# Patient Record
Sex: Female | Born: 1980 | Race: White | Hispanic: No | Marital: Single | Smoking: Current every day smoker
Health system: Southern US, Community
[De-identification: ages and names within clinical notes are randomized; demographics above are authoritative.]

## PROBLEM LIST (undated history)

## (undated) DIAGNOSIS — M797 Fibromyalgia: Secondary | ICD-10-CM

## (undated) DIAGNOSIS — M549 Dorsalgia, unspecified: Secondary | ICD-10-CM

## (undated) DIAGNOSIS — Z9109 Other allergy status, other than to drugs and biological substances: Secondary | ICD-10-CM

## (undated) DIAGNOSIS — G8929 Other chronic pain: Secondary | ICD-10-CM

---

## 2002-06-30 ENCOUNTER — Emergency Department (HOSPITAL_COMMUNITY): Admission: EM | Admit: 2002-06-30 | Discharge: 2002-06-30 | Payer: Self-pay | Admitting: *Deleted

## 2003-12-24 ENCOUNTER — Emergency Department (HOSPITAL_COMMUNITY): Admission: EM | Admit: 2003-12-24 | Discharge: 2003-12-24 | Payer: Self-pay | Admitting: Emergency Medicine

## 2004-02-10 ENCOUNTER — Other Ambulatory Visit: Admission: RE | Admit: 2004-02-10 | Discharge: 2004-02-10 | Payer: Self-pay | Admitting: Obstetrics and Gynecology

## 2004-08-02 ENCOUNTER — Inpatient Hospital Stay (HOSPITAL_COMMUNITY): Admission: AD | Admit: 2004-08-02 | Discharge: 2004-08-05 | Payer: Self-pay | Admitting: Obstetrics and Gynecology

## 2004-10-07 ENCOUNTER — Encounter: Admission: RE | Admit: 2004-10-07 | Discharge: 2004-10-07 | Payer: Self-pay | Admitting: Orthopedic Surgery

## 2005-04-22 ENCOUNTER — Emergency Department (HOSPITAL_COMMUNITY): Admission: EM | Admit: 2005-04-22 | Discharge: 2005-04-22 | Payer: Self-pay | Admitting: Emergency Medicine

## 2005-07-03 ENCOUNTER — Emergency Department (HOSPITAL_COMMUNITY): Admission: EM | Admit: 2005-07-03 | Discharge: 2005-07-03 | Payer: Self-pay | Admitting: Emergency Medicine

## 2005-08-20 ENCOUNTER — Emergency Department (HOSPITAL_COMMUNITY): Admission: EM | Admit: 2005-08-20 | Discharge: 2005-08-20 | Payer: Self-pay | Admitting: Emergency Medicine

## 2005-09-18 ENCOUNTER — Emergency Department (HOSPITAL_COMMUNITY): Admission: EM | Admit: 2005-09-18 | Discharge: 2005-09-18 | Payer: Self-pay | Admitting: Emergency Medicine

## 2005-09-24 ENCOUNTER — Emergency Department (HOSPITAL_COMMUNITY): Admission: EM | Admit: 2005-09-24 | Discharge: 2005-09-24 | Payer: Self-pay | Admitting: Emergency Medicine

## 2005-10-08 ENCOUNTER — Emergency Department (HOSPITAL_COMMUNITY): Admission: EM | Admit: 2005-10-08 | Discharge: 2005-10-08 | Payer: Self-pay | Admitting: Emergency Medicine

## 2005-10-15 ENCOUNTER — Emergency Department (HOSPITAL_COMMUNITY): Admission: EM | Admit: 2005-10-15 | Discharge: 2005-10-15 | Payer: Self-pay | Admitting: Emergency Medicine

## 2005-10-23 ENCOUNTER — Emergency Department (HOSPITAL_COMMUNITY): Admission: EM | Admit: 2005-10-23 | Discharge: 2005-10-23 | Payer: Self-pay | Admitting: Emergency Medicine

## 2005-11-02 ENCOUNTER — Emergency Department (HOSPITAL_COMMUNITY): Admission: EM | Admit: 2005-11-02 | Discharge: 2005-11-02 | Payer: Self-pay | Admitting: Emergency Medicine

## 2005-11-18 ENCOUNTER — Emergency Department (HOSPITAL_COMMUNITY): Admission: EM | Admit: 2005-11-18 | Discharge: 2005-11-18 | Payer: Self-pay | Admitting: Emergency Medicine

## 2005-12-16 ENCOUNTER — Emergency Department (HOSPITAL_COMMUNITY): Admission: EM | Admit: 2005-12-16 | Discharge: 2005-12-16 | Payer: Self-pay | Admitting: Emergency Medicine

## 2006-04-02 ENCOUNTER — Emergency Department (HOSPITAL_COMMUNITY): Admission: EM | Admit: 2006-04-02 | Discharge: 2006-04-02 | Payer: Self-pay | Admitting: Emergency Medicine

## 2006-05-02 ENCOUNTER — Emergency Department (HOSPITAL_COMMUNITY): Admission: EM | Admit: 2006-05-02 | Discharge: 2006-05-02 | Payer: Self-pay | Admitting: Emergency Medicine

## 2006-07-17 ENCOUNTER — Emergency Department (HOSPITAL_COMMUNITY): Admission: EM | Admit: 2006-07-17 | Discharge: 2006-07-17 | Payer: Self-pay | Admitting: Emergency Medicine

## 2006-08-14 ENCOUNTER — Encounter: Admission: RE | Admit: 2006-08-14 | Discharge: 2006-11-12 | Payer: Self-pay | Admitting: Orthopedic Surgery

## 2006-10-11 ENCOUNTER — Emergency Department (HOSPITAL_COMMUNITY): Admission: EM | Admit: 2006-10-11 | Discharge: 2006-10-11 | Payer: Self-pay | Admitting: Emergency Medicine

## 2006-11-28 ENCOUNTER — Ambulatory Visit (HOSPITAL_COMMUNITY): Admission: RE | Admit: 2006-11-28 | Discharge: 2006-11-28 | Payer: Self-pay | Admitting: Obstetrics and Gynecology

## 2007-06-08 ENCOUNTER — Emergency Department (HOSPITAL_COMMUNITY): Admission: EM | Admit: 2007-06-08 | Discharge: 2007-06-08 | Payer: Self-pay | Admitting: Emergency Medicine

## 2007-09-07 ENCOUNTER — Emergency Department (HOSPITAL_COMMUNITY): Admission: EM | Admit: 2007-09-07 | Discharge: 2007-09-07 | Payer: Self-pay | Admitting: Emergency Medicine

## 2007-11-13 ENCOUNTER — Emergency Department (HOSPITAL_COMMUNITY): Admission: EM | Admit: 2007-11-13 | Discharge: 2007-11-13 | Payer: Self-pay | Admitting: Emergency Medicine

## 2008-03-09 ENCOUNTER — Emergency Department (HOSPITAL_COMMUNITY): Admission: EM | Admit: 2008-03-09 | Discharge: 2008-03-09 | Payer: Self-pay | Admitting: Emergency Medicine

## 2008-05-04 ENCOUNTER — Encounter: Payer: Self-pay | Admitting: Emergency Medicine

## 2008-05-05 ENCOUNTER — Inpatient Hospital Stay (HOSPITAL_COMMUNITY): Admission: AD | Admit: 2008-05-05 | Discharge: 2008-05-07 | Payer: Self-pay | Admitting: *Deleted

## 2008-05-05 ENCOUNTER — Ambulatory Visit: Payer: Self-pay | Admitting: *Deleted

## 2009-02-01 ENCOUNTER — Emergency Department (HOSPITAL_COMMUNITY): Admission: EM | Admit: 2009-02-01 | Discharge: 2009-02-01 | Payer: Self-pay | Admitting: Emergency Medicine
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
## 2010-05-11 IMAGING — US US PELVIS COMPLETE MODIFY
1 series · 14 of 25 positions shown · non-contrast
Comparison: None

CLINICAL DATA: Abdominal swelling.  Rule out torsion.

TRANSABDOMINAL AND TRANSVAGINAL ULTRASOUND OF PELVIS
DOPPLER ULTRASOUND OF OVARIES
TECHNIQUE: Both transabdominal and transvaginal ultrasound
examinations of the pelvis were performed including evaluation of
the uterus, ovaries, adnexal regions, and pelvic cul-de-sac. Color
and duplex Doppler ultrasound was utilized to evaluate blood flow
to the ovaries.

[Series 1: unknown · 0.25mm/px · 14 of 48 slices shown]
[im 1/48]
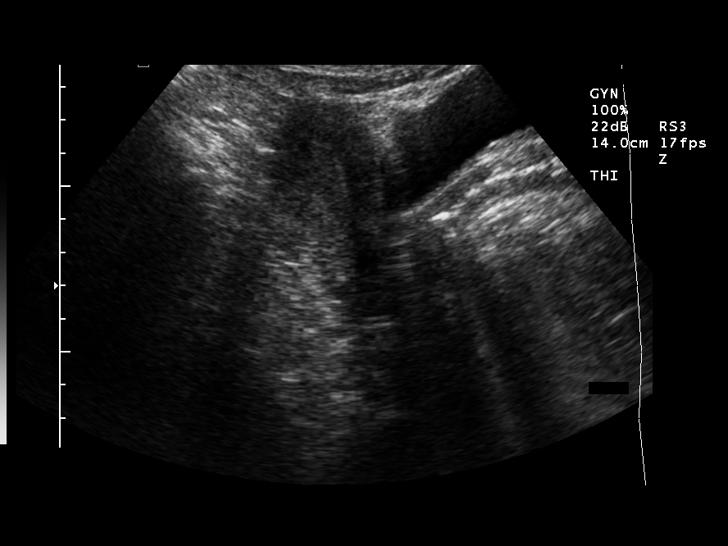
[im 4/48]
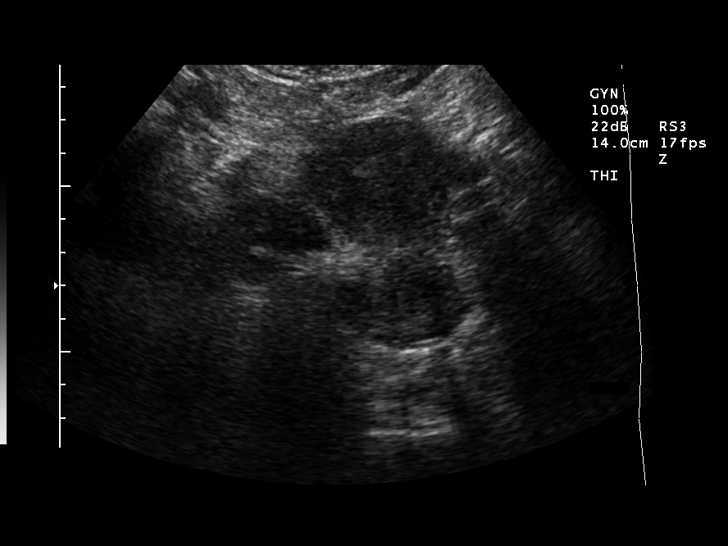
[im 8/48]
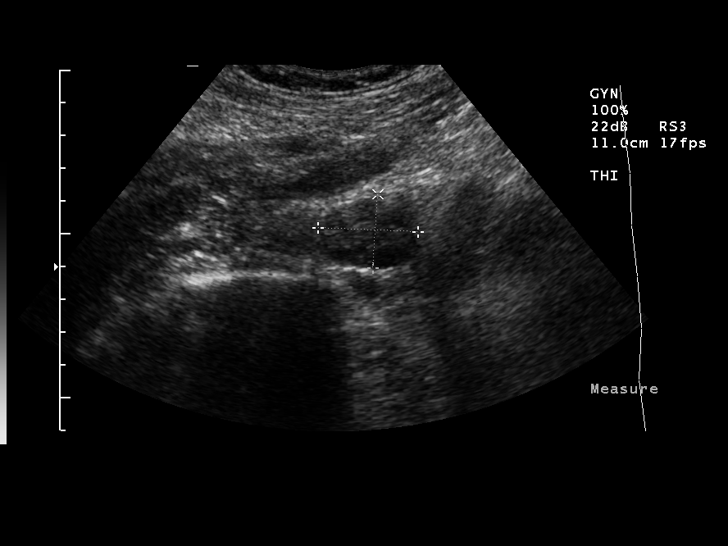
[im 12/48]
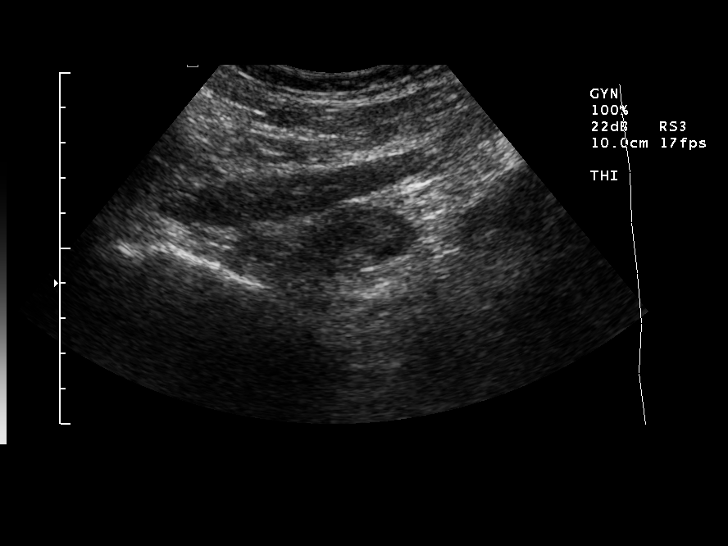
[im 16/48]
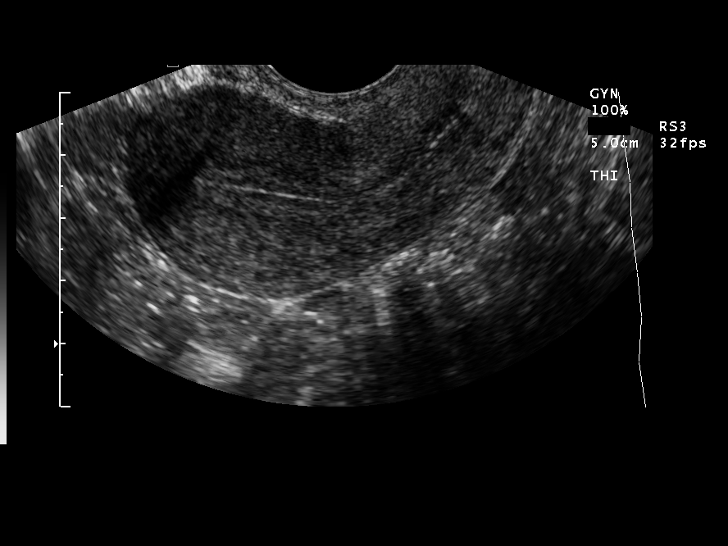
[im 18/48]
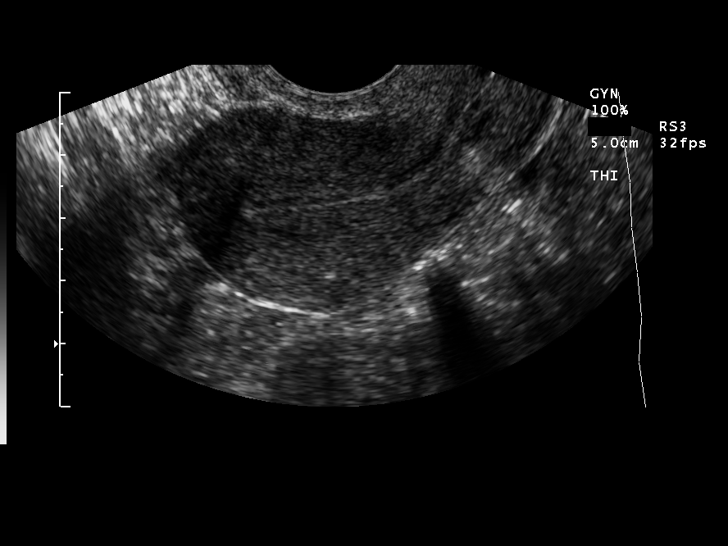
[im 22/48]
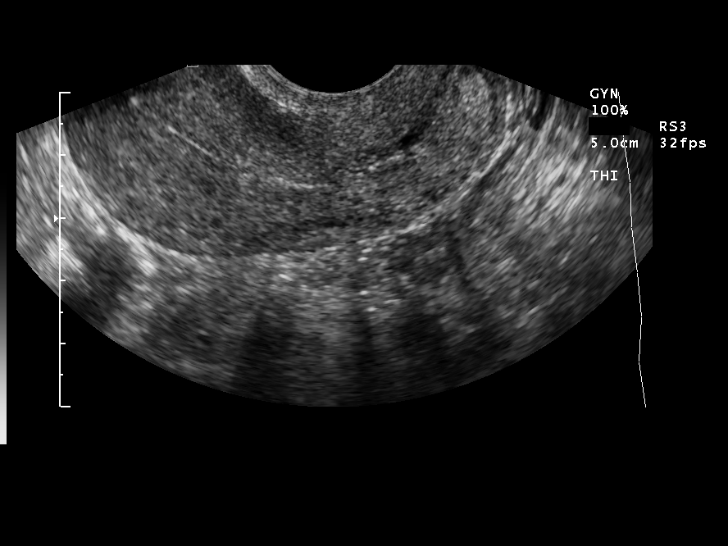
[im 26/48]
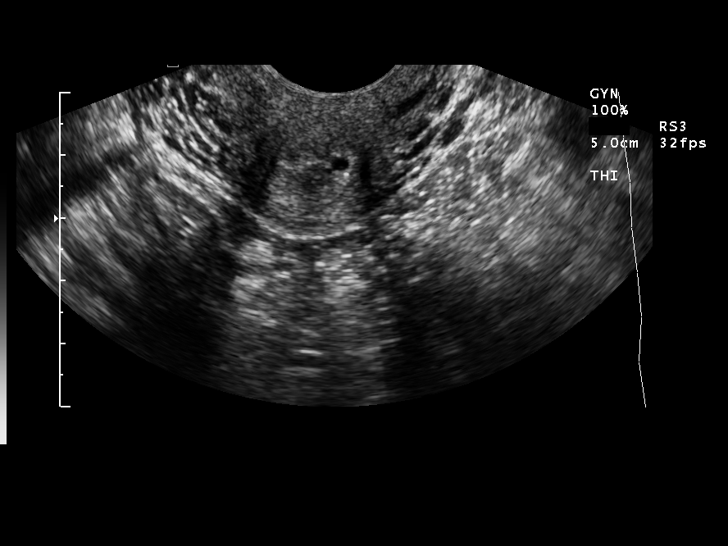
[im 30/48]
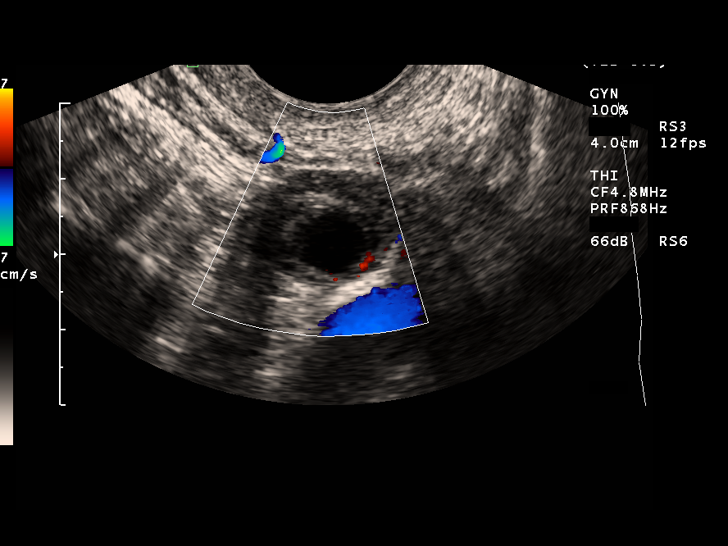
[im 32/48]
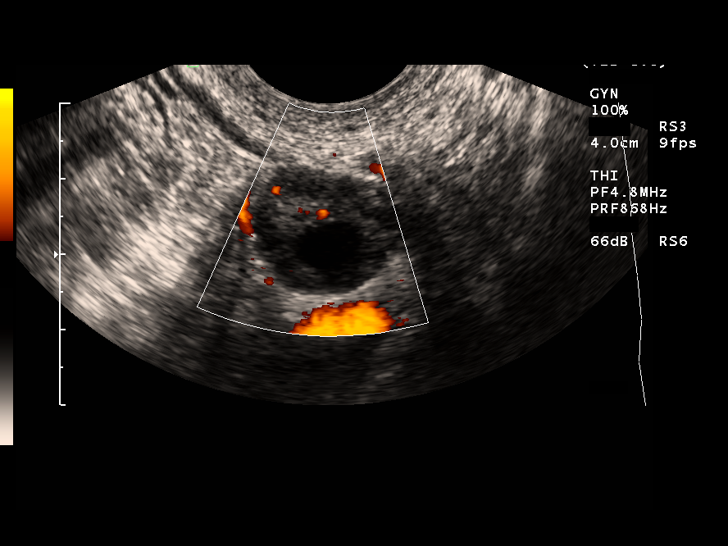
[im 36/48]
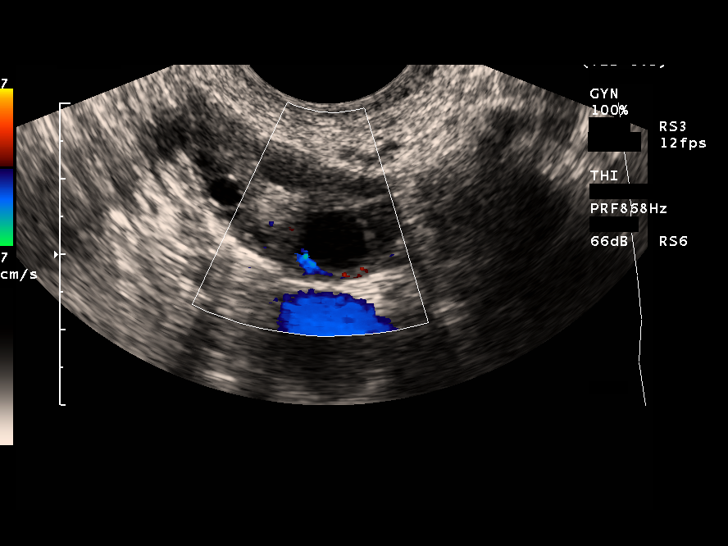
[im 40/48]
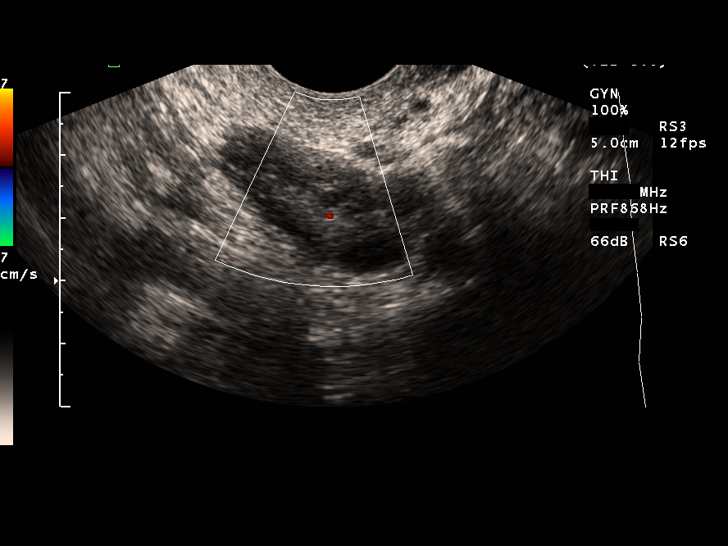
[im 44/48]
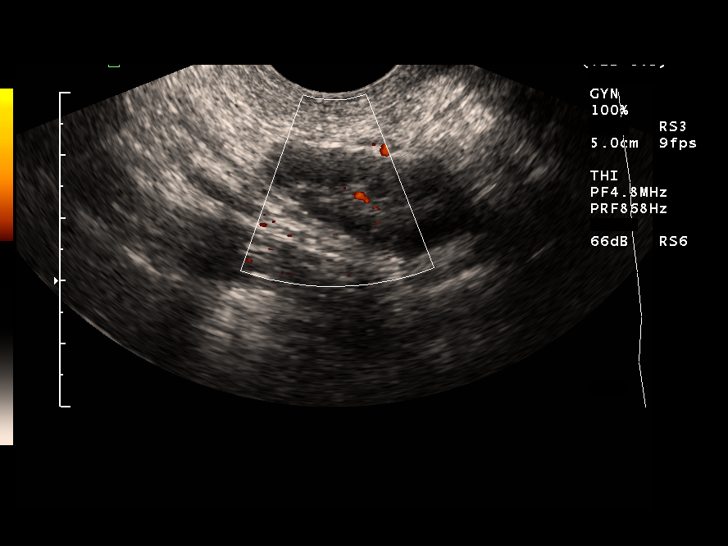
[im 48/48]
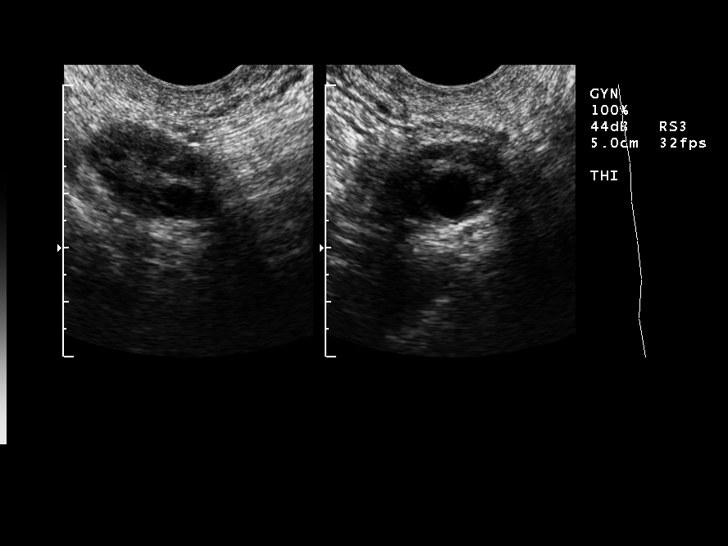

[14 of 25 positions shown; findings below may reference images not displayed]

FINDINGS: The uterus is 7.0 x 4.1 x 3.8 cm.  The endometrium has a
normal appearance and measures 1 mm in thickness.  The right ovary
is 3.3 x 2.6 x 1.8 cm.  Arterial and venous blood flow is
identified within the right ovary.  The left ovary is 3.3 x 2.0 x
1.1 cm.  Arterial and venous blood flow are also identified on the
left.  No free pelvic fluid is noted.
IMPRESSION: Normal pelvic ultrasound.  There is no evidence for complete
torsion in either ovary at the time of the exam.
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
## 2011-03-22 NOTE — Discharge Summary (Signed)
Angie Humphrey, Angie Humphrey              ACCOUNT NO.:  [id number]   MEDICAL RECORD NO.:  [id number]          PATIENT TYPE:  INP   LOCATION:  9132                          [facility]ACILITY:  [facility]   PHYSICIAN:  Zenaida Niece, M.D.DATE OF BIRTH:  June 02, 1981   DATE OF ADMISSION:  08/02/2004  DATE OF DISCHARGE:  08/05/2004                                 DISCHARGE SUMMARY   ADMISSION DIAGNOSIS:  Intrauterine pregnancy at 41 weeks.   DISCHARGE DIAGNOSIS:  Intrauterine pregnancy at 41 weeks.   PROCEDURE PERFORMED:  On August 03, 2004 she had a forceps assisted  vaginal delivery.   HISTORY AND PHYSICAL:  This is a 30 year old, white female, gravida 1, para  0 with an EGA of [redacted] weeks by an 11 weeks ultrasound with a due date of  July 23, 2004 who presented with the complaint of pelvic pressure and  vaginal spotting.  It is unclear from the history and physical whether she  was seen in the office for a routine exam or whether she came in for  contractions but she had a favorable exam and was admitted for induction.   PRENATAL CARE:  Complicated by an amniotic band noted on screening  ultrasound and initial followup ultrasound with a third ultrasound on April 18, 2004 revealing no amniotic band.   PRENATAL LABORATORY DATA:  Blood type is A positive with a negative antibody  screen.  RPR nonreactive.  Rubella immune.  Hepatitis B surface antigen  negative.  HIV negative.  Gonorrhea was negative.  Chlamydia was positive.  Triple screen was normal.  Group B Streptococcus was negative.  One hour  Glucola was 105.   PAST HISTORY:   ALLERGIES:  She has allergies to aspirin and ibuprofen.   PAST MEDICAL HISTORY:  History of depression and recent history of  Chlamydia.   FAMILY HISTORY:  She has a Family History of von Willebrand's disease.   PHYSICAL EXAMINATION:  VITAL SIGNS:  She was afebrile with stable vital  signs.  ABDOMEN:  Was gravid with a fundal height of 35 cm.  Fetal heart  tracing was  normal.  The cervix was 2, 70, -2, and vertex.   HOSPITAL COURSE:  The patient was admitted and started on Pitocin.  She  contracted well and entered labor and received an epidural.  During labor,  she had several episodes of decelerations followed by Dr. Ambrose Mantle.  She  progressed to complete and began pushing but had persistent decelerations  and Dr. Ambrose Mantle decided to perform a forceps assisted vaginal delivery on the  morning of August 03, 2004.  This was a viable female infant with Apgar's  of 9 and 9 who weighed 6 pounds 14 ounces.  The NICU team with Angelita Ingles, M.D. attended the delivery.  The placenta delivered intact but was  small.  The uterus palpated normal.  She  had a left medial lateral  episiotomy and a right upper labial laceration repaired with 2-0 and 3-0  Vicryl.  Estimated blood loss was 400 cubic centimeters.  There was a note  that the temperature was 100.5  right before delivery.  Postpartum she had no  significant complications.  Predelivery hemoglobin was 14.4, postdelivery  was 11.8.  On postpartum day #2, she was stable for discharge home.   DISCHARGE INSTRUCTIONS:   DIET:  Regular.   DISCHARGE ACTIVITIES:  Pelvic rest.   FOLLOW UP:  In six weeks.   DISCHARGE MEDICATIONS:  Percocet #30 1-2 p.o. q.4-6h. p.r.n. pain.   She was given our discharge pamphlet.     Todd   TDM/MEDQ  D:  08/05/2004  T:  08/06/2004  Job:  [id number]   cc:   Angelita Ingles, M.D.  [address]  [place]  [place] [address]  Fax: [phone]

## 2011-03-22 NOTE — Op Note (Signed)
Angie Humphrey, Angie Humphrey              ACCOUNT NO.:  [id number]   MEDICAL RECORD NO.:  [id number]          PATIENT TYPE:  AMB   LOCATION:  SDC                           [facility]ILITY:  [facility]   PHYSICIAN:  Malachi Pro. Ambrose Mantle, M.D. DATE OF BIRTH:  [birth year]   DATE OF PROCEDURE:  11/28/2006  DATE OF DISCHARGE:                               OPERATIVE REPORT   PREOPERATIVE DIAGNOSIS:  Voluntary sterilization.   POSTOPERATIVE DIAGNOSIS:  Voluntary sterilization.   OPERATION:  Laparoscopic tubal cauterization.   OPERATOR:  Malachi Pro. Ambrose Mantle, M.D.   General anesthesia.   The patient was brought to the operating room and placed under  satisfactory general anesthesia and placed in the Allen stirrups in the  modified lithotomy position.  The lower abdomen, vagina, urethra and  vulva were prepped with Betadine solution and a Foley catheter was used  to drain the bladder.  A Hulka cannula was placed into the uterus and  attached to the posterior cervical lip.  The uterus was retroverted,  normal size.  The adnexa were free of masses.  The bladder was emptied  with a catheter.  The abdomen was draped as a sterile field.  Because of  the patient's abdominal wall configuration, I chose to make an open  laparoscopic incision, made a semilunar incision through the skin of the  lower part of the umbilicus, carried it in layers down to the fascia,  incised the fascia, and then entered the peritoneum by sharp dissection.  A pursestring suture of 0 Vicryl was placed around the umbilical  incision opening through the fascia and then the Hasson cannula was used  to go into the peritoneal cavity.  I confirmed the placement with the  microscope and then I inflated the abdomen with CO2, inserted to a  smaller trocar suprapubically through a stab wound under direct vision  to visualize the upper abdomen.  The liver was smooth.  No upper  abdominal abnormalities were noted.  I visualized the pelvis.  The  anterior and  posterior cul-de-sacs were normal.  The uterus was normal  size, although it seemed to be sort of deviated to the left.  Both tubes  and ovaries were perfectly normal.  The tubes were normal to their  fimbriated ends.  I cauterized both tubes beginning in the central part  of the tube and going toward the cornu in 3 or 4 consecutive locations  with a current of 15 until the meter read 0.  The tube tended to stick  to the instrument, but we were able to release it each time without  injury to the tube.  The cauterization was complete.  I removed the  Hasson cannula and the lower abdominal trocar, closed the umbilical  incision by pulling down tight on the 0 Vicryl suture.  This left no  defects.  I then closed the subcu tissue with 3-0 Vicryl and the skin  with interrupted 3-0 plain catgut.  The lower incision was closed with a  Steri-Strip and one suture of 3-0 plain catgut.  The patient seemed to  tolerate the procedure well.  The Hulka cannula was removed and the  patient returned to recovery in satisfactory condition.  Blood loss was  less 10 mL.      Malachi Pro. Ambrose Mantle, M.D.  Electronically Signed    TFH/MEDQ  D:  11/28/2006  T:  11/28/2006  Job:  [id number]

## 2011-08-01 LAB — HEPATIC FUNCTION PANEL
ALT: 32
AST: 27
Albumin: 3.4 — ABNORMAL LOW
Alkaline Phosphatase: 49
Bilirubin, Direct: <0.1
Total Bilirubin: 0.4
Total Protein: 6

## 2011-08-01 LAB — BASIC METABOLIC PANEL
BUN: 10
CO2: 27
Calcium: 9.2
Chloride: 110
Creatinine, Ser: 0.86
GFR calc Af Amer: >60
GFR calc non Af Amer: >60
Glucose, Bld: 98
Potassium: 3.9
Sodium: 144

## 2011-08-01 LAB — CBC
HCT: 44.8
HCT: 40.1
Hemoglobin: 15.2 — ABNORMAL HIGH
Hemoglobin: 13.7
MCHC: 34
MCHC: 34.3
MCV: 96.3
MCV: 96.3
Platelets: 277
Platelets: 213
RBC: 4.65
RBC: 4.16
RDW: 13.6
RDW: 13.9
WBC: 9.2
WBC: 7

## 2011-08-01 LAB — URINALYSIS, ROUTINE W REFLEX MICROSCOPIC
Bilirubin Urine: NEGATIVE
Glucose, UA: NEGATIVE
Hgb urine dipstick: NEGATIVE
Ketones, ur: NEGATIVE
Nitrite: NEGATIVE
Protein, ur: NEGATIVE
Specific Gravity, Urine: 1.013
Urobilinogen, UA: 1
pH: 7.5

## 2011-08-01 LAB — ETHANOL: Alcohol, Ethyl (B): 194 — ABNORMAL HIGH

## 2011-08-01 LAB — DIFFERENTIAL
Basophils Absolute: 0.1
Basophils Absolute: 0
Basophils Relative: 1
Basophils Relative: 1
Eosinophils Absolute: 0.2
Eosinophils Absolute: 0.2
Eosinophils Relative: 2
Eosinophils Relative: 3
Lymphocytes Relative: 59 — ABNORMAL HIGH
Lymphocytes Relative: 64 — ABNORMAL HIGH
Lymphs Abs: 5.4 — ABNORMAL HIGH
Lymphs Abs: 4.5 — ABNORMAL HIGH
Monocytes Absolute: 0.5
Monocytes Absolute: 0.5
Monocytes Relative: 6
Monocytes Relative: 7
Neutro Abs: 3
Neutro Abs: 1.7
Neutrophils Relative %: 33 — ABNORMAL LOW
Neutrophils Relative %: 25 — ABNORMAL LOW

## 2011-08-01 LAB — WET PREP, GENITAL
Trich, Wet Prep: NONE SEEN
Yeast Wet Prep HPF POC: NONE SEEN

## 2011-08-01 LAB — RAPID URINE DRUG SCREEN, HOSP PERFORMED
Amphetamines: NOT DETECTED
Barbiturates: NOT DETECTED
Benzodiazepines: POSITIVE — AB
Cocaine: NOT DETECTED
Opiates: NOT DETECTED
Tetrahydrocannabinol: NOT DETECTED

## 2011-08-01 LAB — GC/CHLAMYDIA PROBE AMP, GENITAL
Chlamydia, DNA Probe: POSITIVE — AB
GC Probe Amp, Genital: NEGATIVE

## 2011-08-01 LAB — VALPROIC ACID LEVEL
Valproic Acid Lvl: 31 — ABNORMAL LOW
Valproic Acid Lvl: 27.3 — ABNORMAL LOW

## 2011-08-01 LAB — URINE MICROSCOPIC-ADD ON

## 2011-08-01 LAB — ACETAMINOPHEN LEVEL: Acetaminophen (Tylenol), Serum: <10 — ABNORMAL LOW

## 2011-08-01 LAB — PREGNANCY, URINE: Preg Test, Ur: NEGATIVE

## 2011-08-01 LAB — RPR: RPR Ser Ql: NONREACTIVE

## 2011-08-01 LAB — SALICYLATE LEVEL: Salicylate Lvl: <4

## 2013-05-06 ENCOUNTER — Emergency Department (HOSPITAL_COMMUNITY)
Admission: EM | Admit: 2013-05-06 | Discharge: 2013-05-06 | Disposition: A | Discharge status: Home / Self Care | Payer: Self-pay | Attending: Emergency Medicine | Admitting: Emergency Medicine

## 2013-05-06 ENCOUNTER — Emergency Department (HOSPITAL_COMMUNITY): Payer: Self-pay

## 2013-05-06 ENCOUNTER — Encounter (HOSPITAL_COMMUNITY): Payer: Self-pay | Admitting: Emergency Medicine

## 2013-05-06 DIAGNOSIS — R197 Diarrhea, unspecified: Secondary | ICD-10-CM | POA: Insufficient documentation

## 2013-05-06 DIAGNOSIS — R109 Unspecified abdominal pain: Secondary | ICD-10-CM | POA: Insufficient documentation

## 2013-05-06 DIAGNOSIS — Z8739 Personal history of other diseases of the musculoskeletal system and connective tissue: Secondary | ICD-10-CM | POA: Insufficient documentation

## 2013-05-06 DIAGNOSIS — R102 Pelvic and perineal pain: Secondary | ICD-10-CM

## 2013-05-06 DIAGNOSIS — N83209 Unspecified ovarian cyst, unspecified side: Secondary | ICD-10-CM

## 2013-05-06 DIAGNOSIS — M549 Dorsalgia, unspecified: Secondary | ICD-10-CM | POA: Insufficient documentation

## 2013-05-06 DIAGNOSIS — R198 Other specified symptoms and signs involving the digestive system and abdomen: Secondary | ICD-10-CM | POA: Insufficient documentation

## 2013-05-06 DIAGNOSIS — N949 Unspecified condition associated with female genital organs and menstrual cycle: Secondary | ICD-10-CM | POA: Insufficient documentation

## 2013-05-06 DIAGNOSIS — N831 Corpus luteum cyst of ovary, unspecified side: Secondary | ICD-10-CM | POA: Insufficient documentation

## 2013-05-06 HISTORY — DX: Other allergy status, other than to drugs and biological substances: Z91.09

## 2013-05-06 HISTORY — DX: Fibromyalgia: M79.7

## 2013-05-06 LAB — CBC WITH DIFFERENTIAL/PLATELET
Basophils Absolute: 0 10*3/uL (ref 0.0–0.1)
Basophils Relative: 0 % (ref 0–1)
HCT: 41.2 % (ref 36.0–46.0)
MCHC: 35.2 g/dL (ref 30.0–36.0)
Monocytes Absolute: 0.5 10*3/uL (ref 0.1–1.0)
Neutro Abs: 3.6 10*3/uL (ref 1.7–7.7)
Platelets: 257 10*3/uL (ref 150–400)
RDW: 12.7 % (ref 11.5–15.5)

## 2013-05-06 LAB — WET PREP, GENITAL
Trich, Wet Prep: NONE SEEN
Yeast Wet Prep HPF POC: NONE SEEN

## 2013-05-06 LAB — HCG, QUANTITATIVE, PREGNANCY: hCG, Beta Chain, Quant, S: <1 m[IU]/mL (ref <5)

## 2013-05-06 MED ORDER — OXYCODONE-ACETAMINOPHEN 5-325 MG PO TABS
1.0000 | ORAL_TABLET | Freq: Once | ORAL | Status: AC
  Administered 2013-05-06: 1 via ORAL
  Filled 2013-05-06: qty 1

## 2013-05-06 MED ORDER — HYDROCODONE-ACETAMINOPHEN 5-325 MG PO TABS: 1.0000 | ORAL_TABLET | ORAL | Status: DC | PRN

## 2013-05-06 NOTE — ED Notes (Signed)
States that she has had multiple periods this month and is having heavy vaginal bleeding. States that she has pelvic pain when the bleeding begins.

## 2013-05-06 NOTE — ED Provider Notes (Signed)
History    CSN: [id number] Arrival date & time 05/06/13  1452  First MD Initiated Contact with Patient 05/06/13 1455     Chief Complaint  Patient presents with  . Vaginal Bleeding   (Consider location/radiation/quality/duration/timing/severity/associated sxs/prior Treatment) Patient is a 32 y.o. female presenting with vaginal bleeding. The history is provided by the patient.  Vaginal Bleeding Quality:  Bright red, heavier than menses and clots Severity:  Moderate Onset quality:  Gradual Duration:  2 days Timing:  Constant Progression:  Worsening Chronicity:  New Menstrual history:  Regular Number of pads used:  8 per day Possible pregnancy: no   Context: spontaneously   Relieved by:  Nothing Worsened by:  Nothing tried Ineffective treatments:  Acetaminophen Associated symptoms: abdominal pain and back pain   Associated symptoms: no dizziness, no dysuria, no fever, no nausea and no vaginal discharge  Dyspareunia: last had sex before the bleeding or pain started.   Risk factors: unprotected sex   Risk factors: no bleeding disorder, no hx of ectopic pregnancy, no hx of endometriosis, no gynecological surgery, does not have multiple partners, no new sexual partner (current sex partner x 4 months) and no STD    Angie Humphrey is a 32 y.o. female, G3 P1, who presents to the ED with vaginal bleeding. She states that she had a regular period and then has had more bleeding. She has had a BTL for birth control, last pap smear less than 2 years ago and was normal. She has severe pelvic pain for the past 2 days. Last intercourse was before the pain and bleeding started.  Past Medical History  Diagnosis Date  . Fibromyalgia   . Environmental allergies    History reviewed. No pertinent past surgical history. No family history on file. History  Substance Use Topics  . Smoking status: Not on file  . Smokeless tobacco: Not on file  . Alcohol Use: Not on file   OB History   Grav Para  Term Preterm Abortions TAB SAB Ect Mult Living                 Review of Systems  Constitutional: Negative for fever and chills.  HENT: Negative for ear pain, sore throat and neck pain.   Respiratory: Negative for cough and shortness of breath.   Cardiovascular: Negative for chest pain and leg swelling.  Gastrointestinal: Positive for abdominal pain and diarrhea. Negative for nausea, vomiting, constipation and blood in stool.  Genitourinary: Positive for vaginal bleeding and pelvic pain. Negative for dysuria, urgency, frequency and vaginal discharge. Dyspareunia: last had sex before the bleeding or pain started.  Musculoskeletal: Positive for back pain.  Neurological: Negative for dizziness.    Allergies  Aspirin and Ibuprofen  Home Medications  No current outpatient prescriptions on file. BP 112/85  Pulse 77  Temp(Src) 98.7 F (37.1 C) (Oral)  Resp 16  Ht 5\' 5"  (1.651 m)  Wt 150 lb (68.04 kg)  BMI 24.96 kg/m2  SpO2 100% Physical Exam  Nursing note and vitals reviewed. Constitutional: She is oriented to person, place, and time. She appears well-developed and well-nourished. No distress.  HENT:  Head: Normocephalic.  Eyes: EOM are normal.  Neck: Neck supple.  Cardiovascular: Normal rate, regular rhythm and normal heart sounds.   Pulmonary/Chest: Effort normal and breath sounds normal.  Abdominal: Soft. Bowel sounds are normal. There is tenderness in the right lower quadrant, suprapubic area and left lower quadrant. There is guarding and CVA tenderness. There is no  rebound.  Tender across lower abdomen with increased tenderness in the RLQ.   Genitourinary:  External genitalia without lesions. Small blood vaginal vault. Positive CMT, bilateral adnexal tenderness right >left. Uterus without palpable enlargement.  Musculoskeletal: Normal range of motion.  Neurological: She is alert and oriented to person, place, and time. No cranial nerve deficit.  Skin: Skin is warm and dry.   Psychiatric: She has a normal mood and affect. Her behavior is normal.   Results for orders placed during the hospital encounter of 05/06/13 (from the past 24 hour(s))  CBC WITH DIFFERENTIAL     Status: None   Collection Time    05/06/13  3:03 PM      Result Value Range   WBC 7.7  4.0 - 10.5 K/uL   RBC 4.33  3.87 - 5.11 MIL/uL   Hemoglobin 14.5  12.0 - 15.0 g/dL   HCT 16.1  09.6 - 04.5 %   MCV 95.2  78.0 - 100.0 fL   MCH 33.5  26.0 - 34.0 pg   MCHC 35.2  30.0 - 36.0 g/dL   RDW 40.9  81.1 - 91.4 %   Platelets 257  150 - 400 K/uL   Neutrophils Relative % 47  43 - 77 %   Neutro Abs 3.6  1.7 - 7.7 K/uL   Lymphocytes Relative 44  12 - 46 %   Lymphs Abs 3.4  0.7 - 4.0 K/uL   Monocytes Relative 6  3 - 12 %   Monocytes Absolute 0.5  0.1 - 1.0 K/uL   Eosinophils Relative 2  0 - 5 %   Eosinophils Absolute 0.2  0.0 - 0.7 K/uL   Basophils Relative 0  0 - 1 %   Basophils Absolute 0.0  0.0 - 0.1 K/uL  HCG, QUANTITATIVE, PREGNANCY     Status: None   Collection Time    05/06/13  3:03 PM      Result Value Range   hCG, Beta Chain, Quant, S <1  <5 mIU/mL  WET PREP, GENITAL     Status: Abnormal   Collection Time    05/06/13  3:33 PM      Result Value Range   Yeast Wet Prep HPF POC NONE SEEN  NONE SEEN   Trich, Wet Prep NONE SEEN  NONE SEEN   Clue Cells Wet Prep HPF POC FEW (*) NONE SEEN   WBC, Wet Prep HPF POC FEW (*) NONE SEEN    US Transvaginal Non-ob  05/06/2013   *RADIOLOGY REPORT*  Clinical Data: Pelvic pain with vaginal bleeding.  LMP 05/05/2013  TRANSABDOMINAL AND TRANSVAGINAL ULTRASOUND OF PELVIS Technique:  Both transabdominal and transvaginal ultrasound examinations of the pelvis were performed. Transabdominal technique was performed for global imaging of the pelvis including uterus, ovaries, adnexal regions, and pelvic cul-de-sac.  It was necessary to proceed with endovaginal exam following the transabdominal exam to visualize the myometrium, endometrium and adnexa.  Comparison:   05/04/2008  Findings:  Uterus: Is anteverted and anteflexed and demonstrates a sagittal length of 7.2 cm, depth of 3.8 cm and width of 4.6 cm.  No focal mural abnormality is seen  Endometrium: Is homogeneously echogenic with a width of 6.9 mm.  No areas of focal thickening or heterogeneity are seen  Right ovary:  Measures 4.7 x 3.7 x 3.4 cm and contains a slightly complex cystic lesion measuring 3.0 x 3.9 x 3.0 cm.  This demonstrateslow level internal echoes and some very fine the lace- likelinear the structures.  The internal contents are avascular with color Doppler exam and this likely represents a hemorrhagic cyst with an endometrioma secondary consideration.  Some flow is seen in the surrounding stroma with color Doppler exam  Left ovary: Has a normal appearance measuring 2.7 x 1.5 x 2.8 cm. Some flow is identified within the ovarian stroma with color Doppler evaluation  Other findings: No pelvic fluid or separate adnexal masses are seen.  IMPRESSION: Slightly complex right ovarian cyst has an appearance most suggestive of a hemorrhagic cyst with an endometrioma a secondary consideration.  Flow is noted within the ovarian stroma bilaterally.  While this finding would mitigate against the presence of ovarian torsion, it does not exclude it absolutely and treatment should not be deferred in the face of strong clinical suspicion.  Follow-up evaluation is recommended in 6-8 weeks for initial reassessment of the right ovarian lesion.  If this persists than an endometrioma would be likely.  If interval evolution/resolution can be documented, a hemorrhagic cyst can be confirmed.   Original Report Authenticated By: Rhodia Albright, M.D.   US Pelvis Limited  05/06/2013   *RADIOLOGY REPORT*  Clinical Data: Pelvic pain with vaginal bleeding.  LMP 05/05/2013  TRANSABDOMINAL AND TRANSVAGINAL ULTRASOUND OF PELVIS Technique:  Both transabdominal and transvaginal ultrasound examinations of the pelvis were performed.  Transabdominal technique was performed for global imaging of the pelvis including uterus, ovaries, adnexal regions, and pelvic cul-de-sac.  It was necessary to proceed with endovaginal exam following the transabdominal exam to visualize the myometrium, endometrium and adnexa.  Comparison:  05/04/2008  Findings:  Uterus: Is anteverted and anteflexed and demonstrates a sagittal length of 7.2 cm, depth of 3.8 cm and width of 4.6 cm.  No focal mural abnormality is seen  Endometrium: Is homogeneously echogenic with a width of 6.9 mm.  No areas of focal thickening or heterogeneity are seen  Right ovary:  Measures 4.7 x 3.7 x 3.4 cm and contains a slightly complex cystic lesion measuring 3.0 x 3.9 x 3.0 cm.  This demonstrateslow level internal echoes and some very fine the lace- likelinear the structures.  The internal contents are avascular with color Doppler exam and this likely represents a hemorrhagic cyst with an endometrioma secondary consideration.  Some flow is seen in the surrounding stroma with color Doppler exam  Left ovary: Has a normal appearance measuring 2.7 x 1.5 x 2.8 cm. Some flow is identified within the ovarian stroma with color Doppler evaluation  Other findings: No pelvic fluid or separate adnexal masses are seen.  IMPRESSION: Slightly complex right ovarian cyst has an appearance most suggestive of a hemorrhagic cyst with an endometrioma a secondary consideration.  Flow is noted within the ovarian stroma bilaterally.  While this finding would mitigate against the presence of ovarian torsion, it does not exclude it absolutely and treatment should not be deferred in the face of strong clinical suspicion.  Follow-up evaluation is recommended in 6-8 weeks for initial reassessment of the right ovarian lesion.  If this persists than an endometrioma would be likely.  If interval evolution/resolution can be documented, a hemorrhagic cyst can be confirmed.   Original Report Authenticated By: Rhodia Albright,  M.D.    ED Course: Consult with Dr. Despina Hidden and he will follow up with the patient in the office in one week or sooner if pain worsens.   Procedures  MDM  32 y.o. female with severe pelvic pain and abnormal vaginal bleeding. Hemorrhagic right ovarian cyst.  Will treat pain and she will  follow up in one week with Dr. Despina Hidden or sooner for problems.  I have reviewed this patient's vital signs, nurses notes, appropriate labs and imaging.  I have discussed results with the patient in detail and plan of care and she voices understanding.      Medication List         HYDROcodone-acetaminophen 5-325 MG per tablet  Commonly known as:  NORCO/VICODIN  Take 1 tablet by mouth every 4 (four) hours as needed.         [facility] Orlene Och, [place] 05/06/13 [phone]

## 2013-05-07 LAB — GC/CHLAMYDIA PROBE AMP: GC Probe RNA: NEGATIVE

## 2013-05-07 NOTE — ED Provider Notes (Signed)
Medical screening examination/treatment/procedure(s) were performed by non-physician practitioner and as supervising physician I was immediately available for consultation/collaboration.  Hurman Horn, MD 05/07/13 [phone]

## 2013-08-13 ENCOUNTER — Encounter (HOSPITAL_COMMUNITY): Payer: Self-pay | Admitting: Emergency Medicine

## 2013-08-13 ENCOUNTER — Emergency Department (HOSPITAL_COMMUNITY)
Admission: EM | Admit: 2013-08-13 | Discharge: 2013-08-13 | Disposition: A | Discharge status: Home / Self Care | Payer: Self-pay | Attending: Emergency Medicine | Admitting: Emergency Medicine

## 2013-08-13 DIAGNOSIS — Y9389 Activity, other specified: Secondary | ICD-10-CM | POA: Insufficient documentation

## 2013-08-13 DIAGNOSIS — S335XXA Sprain of ligaments of lumbar spine, initial encounter: Secondary | ICD-10-CM | POA: Insufficient documentation

## 2013-08-13 DIAGNOSIS — T148XXA Other injury of unspecified body region, initial encounter: Secondary | ICD-10-CM

## 2013-08-13 DIAGNOSIS — K5900 Constipation, unspecified: Secondary | ICD-10-CM | POA: Insufficient documentation

## 2013-08-13 DIAGNOSIS — F17200 Nicotine dependence, unspecified, uncomplicated: Secondary | ICD-10-CM | POA: Insufficient documentation

## 2013-08-13 DIAGNOSIS — X500XXA Overexertion from strenuous movement or load, initial encounter: Secondary | ICD-10-CM | POA: Insufficient documentation

## 2013-08-13 DIAGNOSIS — G8929 Other chronic pain: Secondary | ICD-10-CM | POA: Insufficient documentation

## 2013-08-13 DIAGNOSIS — Y929 Unspecified place or not applicable: Secondary | ICD-10-CM | POA: Insufficient documentation

## 2013-08-13 DIAGNOSIS — Z8739 Personal history of other diseases of the musculoskeletal system and connective tissue: Secondary | ICD-10-CM | POA: Insufficient documentation

## 2013-08-13 HISTORY — DX: Other chronic pain: G89.29

## 2013-08-13 HISTORY — DX: Dorsalgia, unspecified: M54.9

## 2013-08-13 MED ORDER — HYDROCODONE-ACETAMINOPHEN 5-325 MG PO TABS
1.0000 | ORAL_TABLET | Freq: Once | ORAL | Status: AC
  Administered 2013-08-13: 1 via ORAL
  Filled 2013-08-13: qty 1

## 2013-08-13 MED ORDER — CYCLOBENZAPRINE HCL 5 MG PO TABS: 5.0000 mg | ORAL_TABLET | Freq: Three times a day (TID) | ORAL | Status: DC | PRN

## 2013-08-13 MED ORDER — HYDROCODONE-ACETAMINOPHEN 5-325 MG PO TABS: 1.0000 | ORAL_TABLET | ORAL | Status: DC | PRN

## 2013-08-13 MED ORDER — CYCLOBENZAPRINE HCL 10 MG PO TABS
10.0000 mg | ORAL_TABLET | Freq: Once | ORAL | Status: AC
  Administered 2013-08-13: 10 mg via ORAL
  Filled 2013-08-13: qty 1

## 2013-08-13 NOTE — ED Notes (Signed)
Pain low back since yesterday when "tearing down a building"pain extends into lt arm.  Alert, NAD .

## 2013-08-13 NOTE — ED Provider Notes (Signed)
Medical screening examination/treatment/procedure(s) were performed by non-physician practitioner and as supervising physician I was immediately available for consultation/collaboration.   Celene Kras, MD 08/13/13 [phone]

## 2013-08-13 NOTE — ED Provider Notes (Signed)
CSN: [id number]     Arrival date & time 08/13/13  1037 History  This chart was scribed for Burgess Amor, non-physician practitioner working with Celene Kras, MD by Bennett Scrape, ED Scribe. This patient was seen in room APFT22/APFT22 and the patient's care was started at 12:06 PM.     Chief Complaint  Patient presents with  . Back Pain    Patient is a 32 y.o. female presenting with back pain. The history is provided by the patient. No language interpreter was used.  Back Pain Associated symptoms: no abdominal pain, no chest pain, no dysuria, no fever, no numbness and no weakness     HPI Comments: Angie Humphrey is a 32 y.o. female with a h/o chronic back pain who presents to the Emergency Department complaining of persistent, non-changing lower back pain that radiates down bilateral legs and also has left posterior shoulder pain which feels like a muscle strain that started yesterday. She describes the pain as a "deep ache". She states that the pain is aggravated with sitting and movement. She denies having difficulty ambulating.  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 She states that she moved from [place] 1.5 year ago and ran out of her pain medication, Tramadol and hydrocodone. 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 She states that she attempted to do some stretching at home with no improvement. She reports chronic constipation but denies any urinary or bowel incontinence, numbness and weakness as associated symptoms.  No current PCP. Pt states that she was taken off of medicaid upon moving.   Past Medical History  Diagnosis Date  . Fibromyalgia   . Environmental allergies   . Chronic back pain    History reviewed. No pertinent past surgical history. No family history on file. History  Substance Use Topics  . Smoking status: Current Every Day Smoker     Types: Cigarettes  . Smokeless tobacco: Not on file  . Alcohol Use: Yes     Comment: occ   No OB history provided.  Review of Systems  Constitutional: Negative for fever.  Respiratory: Negative for shortness of breath.   Cardiovascular: Negative for chest pain and leg swelling.  Gastrointestinal: Positive for constipation. Negative for abdominal pain and abdominal distention.  Genitourinary: Negative for dysuria, urgency, frequency, flank pain and difficulty urinating.  Musculoskeletal: Positive for back pain. Negative for gait problem and joint swelling.  Skin: Negative for rash.  Neurological: Negative for weakness and numbness.    Allergies  Aspirin and Ibuprofen-facial swelling per pt at bedside  Home Medications   Current Outpatient Rx  Name  Route  Sig  Dispense  Refill  . cyclobenzaprine (FLEXERIL) 5 MG tablet   Oral   Take 1 tablet (5 mg total) by mouth 3 (three) times daily as needed for muscle spasms.   15 tablet   0   . HYDROcodone-acetaminophen (NORCO/VICODIN) 5-325 MG per tablet   Oral   Take 1 tablet by mouth every 4 (four) hours as needed for pain.   5 tablet   0     Triage Vitals: BP 120/80  Pulse 76  Temp(Src) 98.3 F (36.8 C) (Oral)  Resp 18  Ht 5\' 5"  (1.651 m)  Wt 165 lb (74.844 kg)  BMI 27.46 kg/m2  SpO2 100%  LMP 08/02/2013  Physical Exam  Nursing note and vitals reviewed.  Constitutional: She appears well-developed and well-nourished.  HENT:  Head: Normocephalic.  Eyes: Conjunctivae are normal.  Neck: Normal range of motion. Neck supple.  Cardiovascular: Normal rate and intact distal pulses.   Pedal pulses normal.  Pulmonary/Chest: Effort normal.  Abdominal: Soft. Bowel sounds are normal. She exhibits no distension and no mass.  Musculoskeletal: Normal range of motion. She exhibits no edema.       Lumbar back: She exhibits tenderness. She exhibits no swelling, no edema and no spasm.  Midline tenderness over the sacral area. No  midline lumbar tenderness   Neurological: She is alert. She has normal strength. She displays no atrophy and no tremor. No sensory deficit. Gait normal.  No strength deficit noted in hip and knee flexor and extensor muscle groups.  Ankle flexion and extension intact.  Skin: Skin is warm and dry.  Psychiatric: She has a normal mood and affect.    ED Course  Procedures (including critical care time)  DIAGNOSTIC STUDIES: Oxygen Saturation is 100% on room air, normal by my interpretation.    COORDINATION OF CARE: 12:15 PM-Discussed discharge plan which includes muscle relaxers and heating pad with pt at bedside and pt agreed to plan. Advised pt that she will not get any narcotics today due to her chronic pain. Pt states that she has a ride home.  Labs Review Labs Reviewed - No data to display Imaging Review No results found.  EKG Interpretation   None       MDM   1. Muscle strain   2. Chronic back pain     No neuro deficit on exam or by history to suggest emergent or surgical presentation.  Also discussed worsened sx that should prompt immediate re-evaluation including distal weakness, bowel/bladder retention/incontinence.  Patient has acute on chronic low back pain with no neuro deficits on exam or by history.  Discussed policy of not prescribing narcotics for chronic pain, suggested tramadol.  She states she has tried this medication previously which was not helpful for flareup of her chronic low back pain.  She was prescribed a small quantity of hydrocodone #5, and encouraged to use a muscle relaxer which was also prescribed.  She cannot tolerate NSAIDs secondary to a history of gastritis.  Referrals were given for obtaining primary care.  Recommended heating pad 20 minutes several times daily.    I personally performed the services described in this documentation, which was scribed in my presence. The recorded information has been reviewed and is accurate.     Burgess Amor,  PA-C 08/13/13 [phone]

## 2013-08-13 NOTE — ED Notes (Signed)
Pt was taking a building down yesterday and began having low back pain, thinks may be her bone spurs, hurts to sit.  H/o of chronic back pain

## 2014-08-02 ENCOUNTER — Emergency Department (HOSPITAL_COMMUNITY): Payer: Self-pay

## 2014-08-02 ENCOUNTER — Emergency Department (HOSPITAL_COMMUNITY)
Admission: EM | Admit: 2014-08-02 | Discharge: 2014-08-02 | Disposition: A | Discharge status: Home / Self Care | Payer: Self-pay | Attending: Emergency Medicine | Admitting: Emergency Medicine

## 2014-08-02 ENCOUNTER — Encounter (HOSPITAL_COMMUNITY): Payer: Self-pay | Admitting: Emergency Medicine

## 2014-08-02 DIAGNOSIS — B9689 Other specified bacterial agents as the cause of diseases classified elsewhere: Secondary | ICD-10-CM

## 2014-08-02 DIAGNOSIS — R109 Unspecified abdominal pain: Secondary | ICD-10-CM | POA: Insufficient documentation

## 2014-08-02 DIAGNOSIS — N83201 Unspecified ovarian cyst, right side: Secondary | ICD-10-CM

## 2014-08-02 DIAGNOSIS — Z3202 Encounter for pregnancy test, result negative: Secondary | ICD-10-CM | POA: Insufficient documentation

## 2014-08-02 DIAGNOSIS — N760 Acute vaginitis: Secondary | ICD-10-CM | POA: Insufficient documentation

## 2014-08-02 DIAGNOSIS — G8929 Other chronic pain: Secondary | ICD-10-CM | POA: Insufficient documentation

## 2014-08-02 DIAGNOSIS — F17200 Nicotine dependence, unspecified, uncomplicated: Secondary | ICD-10-CM | POA: Insufficient documentation

## 2014-08-02 DIAGNOSIS — IMO0001 Reserved for inherently not codable concepts without codable children: Secondary | ICD-10-CM | POA: Insufficient documentation

## 2014-08-02 DIAGNOSIS — N83209 Unspecified ovarian cyst, unspecified side: Secondary | ICD-10-CM | POA: Insufficient documentation

## 2014-08-02 LAB — CBC WITH DIFFERENTIAL/PLATELET
Basophils Absolute: 0.1 10*3/uL (ref 0.0–0.1)
Basophils Relative: 0 % (ref 0–1)
EOS ABS: 0.2 10*3/uL (ref 0.0–0.7)
Eosinophils Relative: 1 % (ref 0–5)
HCT: 43.6 % (ref 36.0–46.0)
HEMOGLOBIN: 14.9 g/dL (ref 12.0–15.0)
LYMPHS ABS: 2.8 10*3/uL (ref 0.7–4.0)
LYMPHS PCT: 25 % (ref 12–46)
MCH: 33.1 pg (ref 26.0–34.0)
MCHC: 34.2 g/dL (ref 30.0–36.0)
MCV: 96.9 fL (ref 78.0–100.0)
MONOS PCT: 7 % (ref 3–12)
Monocytes Absolute: 0.8 10*3/uL (ref 0.1–1.0)
NEUTROS ABS: 7.5 10*3/uL (ref 1.7–7.7)
NEUTROS PCT: 67 % (ref 43–77)
PLATELETS: 282 10*3/uL (ref 150–400)
RBC: 4.5 MIL/uL (ref 3.87–5.11)
RDW: 13.3 % (ref 11.5–15.5)
WBC: 11.4 10*3/uL — AB (ref 4.0–10.5)

## 2014-08-02 LAB — BASIC METABOLIC PANEL
Anion gap: 11 (ref 5–15)
BUN: 10 mg/dL (ref 6–23)
CHLORIDE: 101 meq/L (ref 96–112)
CO2: 27 meq/L (ref 19–32)
Calcium: 9.5 mg/dL (ref 8.4–10.5)
Creatinine, Ser: 0.72 mg/dL (ref 0.50–1.10)
GFR calc Af Amer: >90 mL/min (ref >90)
GLUCOSE: 90 mg/dL (ref 70–99)
POTASSIUM: 4.2 meq/L (ref 3.7–5.3)
SODIUM: 139 meq/L (ref 137–147)

## 2014-08-02 LAB — URINALYSIS, ROUTINE W REFLEX MICROSCOPIC
BILIRUBIN URINE: NEGATIVE
Glucose, UA: NEGATIVE mg/dL
Hgb urine dipstick: NEGATIVE
Ketones, ur: NEGATIVE mg/dL
Leukocytes, UA: NEGATIVE
NITRITE: NEGATIVE
PH: 8 (ref 5.0–8.0)
Protein, ur: NEGATIVE mg/dL
SPECIFIC GRAVITY, URINE: 1.02 (ref 1.005–1.030)
UROBILINOGEN UA: 0.2 mg/dL (ref 0.0–1.0)

## 2014-08-02 LAB — WET PREP, GENITAL: TRICH WET PREP: NONE SEEN

## 2014-08-02 LAB — POC URINE PREG, ED: PREG TEST UR: NEGATIVE

## 2014-08-02 MED ORDER — TRAMADOL HCL 50 MG PO TABS: 50.0000 mg | ORAL_TABLET | Freq: Four times a day (QID) | ORAL | Status: DC | PRN

## 2014-08-02 MED ORDER — ONDANSETRON HCL 4 MG/2ML IJ SOLN
4.0000 mg | Freq: Once | INTRAMUSCULAR | Status: AC
  Administered 2014-08-02: 4 mg via INTRAVENOUS
  Filled 2014-08-02: qty 2

## 2014-08-02 MED ORDER — METRONIDAZOLE 500 MG PO TABS: 500.0000 mg | ORAL_TABLET | Freq: Two times a day (BID) | ORAL | Status: AC

## 2014-08-02 MED ORDER — MORPHINE SULFATE 4 MG/ML IJ SOLN
4.0000 mg | Freq: Once | INTRAMUSCULAR | Status: AC
  Administered 2014-08-02: 4 mg via INTRAVENOUS
  Filled 2014-08-02: qty 1

## 2014-08-02 NOTE — ED Notes (Signed)
Pt presents to department for evaluation of lower abdominal pain and vaginal discharge. Ongoing x1 week. 9/10 pain upon arrival to ED. Denies urinary symptoms. Denies vaginal bleeding. Abdomen soft and non tender to palpation. Bowel sounds present all quadrants.

## 2014-08-02 NOTE — ED Notes (Signed)
Pt c/o lower abd pain x 1 week; pt sts LMP was 2 weeks ago and shorter than normal; pt sts some vaginal discharge

## 2014-08-02 NOTE — Discharge Instructions (Signed)
Take Flagyl as directed until gone. Take Tramadol as needed for pain. Refer to attached documents for more information.

## 2014-08-02 NOTE — ED Provider Notes (Signed)
CSN: [id number]     Arrival date & time 08/02/14  1003 History   First MD Initiated Contact with Patient 08/02/14 1057     Chief Complaint  Patient presents with  . Abdominal Pain     (Consider location/radiation/quality/duration/timing/severity/associated sxs/prior Treatment) HPI Comments: Patient is a 33 year old female who presents with lower abdominal pain for the past week. The pain is located in lower abdomen and does not radiate. The pain is described as cramping and severe. The pain started gradually and progressively worsened since the onset. No alleviating/aggravating factors. The patient has tried nothing for symptoms without relief. Associated symptoms include vaginal discharge. LMP was 10 days ago. Patient denies fever, headache, NVD, chest pain, SOB, dysuria, constipation.    Past Medical History  Diagnosis Date  . Fibromyalgia   . Environmental allergies   . Chronic back pain    History reviewed. No pertinent past surgical history. History reviewed. No pertinent family history. History  Substance Use Topics  . Smoking status: Current Every Day Smoker    Types: Cigarettes  . Smokeless tobacco: Not on file  . Alcohol Use: Yes     Comment: occ   OB History   Grav Para Term Preterm Abortions TAB SAB Ect Mult Living                 Review of Systems  Constitutional: Negative for fever, chills and fatigue.  HENT: Negative for trouble swallowing.   Eyes: Negative for visual disturbance.  Respiratory: Negative for shortness of breath.   Cardiovascular: Negative for chest pain and palpitations.  Gastrointestinal: Negative for nausea, vomiting, abdominal pain and diarrhea.  Genitourinary: Positive for vaginal discharge and pelvic pain. Negative for dysuria and difficulty urinating.  Musculoskeletal: Negative for arthralgias and neck pain.  Skin: Negative for color change.  Neurological: Negative for dizziness and weakness.  Psychiatric/Behavioral: Negative for  dysphoric mood.      Allergies  Aspirin and Ibuprofen  Home Medications   Prior to Admission medications   Medication Sig Start Date End Date Taking? Authorizing Provider  cyclobenzaprine (FLEXERIL) 5 MG tablet Take 1 tablet (5 mg total) by mouth 3 (three) times daily as needed for muscle spasms. 08/13/13   Burgess Amor, PA-C  HYDROcodone-acetaminophen (NORCO/VICODIN) 5-325 MG per tablet Take 1 tablet by mouth every 4 (four) hours as needed for pain. 08/13/13   Burgess Amor, PA-C   BP 114/80  Pulse 74  Temp(Src) 98.3 F (36.8 C) (Oral)  Resp 18  SpO2 100% Physical Exam  Nursing note and vitals reviewed. Constitutional: She is oriented to person, place, and time. She appears well-developed and well-nourished. No distress.  HENT:  Head: Normocephalic and atraumatic.  Eyes: Conjunctivae are normal.  Neck: Normal range of motion.  Cardiovascular: Normal rate and regular rhythm.  Exam reveals no gallop and no friction rub.   No murmur heard. Pulmonary/Chest: Effort normal and breath sounds normal. She has no wheezes. She has no rales. She exhibits no tenderness.  Abdominal: Soft. She exhibits no distension. There is no tenderness. There is no rebound and no guarding.  Genitourinary: Vagina normal.  Normal external genitalia. Moderate amount of greenish/yellow discharge in vaginal canal. Cervical os closed. Bilateral adnexal tenderness to palpation on bimanual exam.   Musculoskeletal: Normal range of motion.  Neurological: She is alert and oriented to person, place, and time. Coordination normal.  Speech is goal-oriented. Moves limbs without ataxia.   Skin: Skin is warm and dry.  Psychiatric: She has a  normal mood and affect. Her behavior is normal.    ED Course  Procedures (including critical care time) Labs Review Labs Reviewed  WET PREP, GENITAL - Abnormal; Notable for the following:    Yeast Wet Prep HPF POC FEW (*)    Clue Cells Wet Prep HPF POC TOO NUMEROUS TO COUNT (*)     WBC, Wet Prep HPF POC TOO NUMEROUS TO COUNT (*)    All other components within normal limits  CBC WITH DIFFERENTIAL - Abnormal; Notable for the following:    WBC 11.4 (*)    All other components within normal limits  GC/CHLAMYDIA PROBE AMP  URINALYSIS, ROUTINE W REFLEX MICROSCOPIC  BASIC METABOLIC PANEL  POC URINE PREG, ED    Imaging Review [place] Transvaginal Non-ob  08/02/2014   CLINICAL DATA:  Abdominal and pelvic pain. History of ovarian cysts.  EXAM: TRANSABDOMINAL AND TRANSVAGINAL ULTRASOUND OF PELVIS  TECHNIQUE: Both transabdominal and transvaginal ultrasound examinations of the pelvis were performed. Transabdominal technique was performed for global imaging of the pelvis including uterus, ovaries, adnexal regions, and pelvic cul-de-sac. It was necessary to proceed with endovaginal exam following the transabdominal exam to visualize the uterus, ovaries, and adnexa.  COMPARISON:  05/06/2013  FINDINGS: Uterus:  7.5 x 3.9 x 4.8 cm.  Normal in morphology.  Endometrium:  Normal, 9 mm.  Right Ovary: 3.9 x 1.8 x 2.9 cm. Peripherally hypervascular lesion within measures maximally 1.5 cm on image 44.  Left Ovary:  2.2 x 1.5 x 2.2 cm.  Normal in morphology.  Other Findings:  Trace free pelvic fluid is likely physiologic.  IMPRESSION: 1. Right ovarian corpus luteal cyst. 2. Otherwise, normal pelvic ultrasound for age.   Electronically Signed   By: Jeronimo GreavesKyle  Talbot M.D.   On: 08/02/2014 13:56   [place] Pelvis Complete  08/02/2014   CLINICAL DATA:  Abdominal and pelvic pain. History of ovarian cysts.  EXAM: TRANSABDOMINAL AND TRANSVAGINAL ULTRASOUND OF PELVIS  TECHNIQUE: Both transabdominal and transvaginal ultrasound examinations of the pelvis were performed. Transabdominal technique was performed for global imaging of the pelvis including uterus, ovaries, adnexal regions, and pelvic cul-de-sac. It was necessary to proceed with endovaginal exam following the transabdominal exam to visualize the uterus, ovaries, and  adnexa.  COMPARISON:  05/06/2013  FINDINGS: Uterus:  7.5 x 3.9 x 4.8 cm.  Normal in morphology.  Endometrium:  Normal, 9 mm.  Right Ovary: 3.9 x 1.8 x 2.9 cm. Peripherally hypervascular lesion within measures maximally 1.5 cm on image 44.  Left Ovary:  2.2 x 1.5 x 2.2 cm.  Normal in morphology.  Other Findings:  Trace free pelvic fluid is likely physiologic.  IMPRESSION: 1. Right ovarian corpus luteal cyst. 2. Otherwise, normal pelvic ultrasound for age.   Electronically Signed   By: Jeronimo GreavesKyle  Talbot M.D.   On: 08/02/2014 13:56     EKG Interpretation None      MDM   Final diagnoses:  Right ovarian cyst  BV (bacterial vaginosis)    11:18 AM Urinalysis and urine pregnancy unremarkable for acute changes. Vitals stable and patient afebrile. Patient will have pelvic exam. Labs pending.   3:06 PM Patient's US shows corpus luteal cyst on the right. Patient's wet prep shows BV. No other acute changes. Vitals stable and patient afebrile. No further evaluation needed at this time. Patient will be discharged with Flagyl and Tramadol for symptoms.   Emilia BeckKaitlyn Shantee Hayne, PA-C 08/02/14 1513

## 2014-08-02 NOTE — ED Provider Notes (Signed)
Medical screening examination/treatment/procedure(s) were performed by non-physician practitioner and as supervising physician I was immediately available for consultation/collaboration.   EKG Interpretation None       Martha K Linker, MD 08/02/14 1521 

## 2014-08-03 ENCOUNTER — Encounter (HOSPITAL_COMMUNITY): Payer: Self-pay | Admitting: Emergency Medicine

## 2014-08-03 ENCOUNTER — Emergency Department (HOSPITAL_COMMUNITY)
Admission: EM | Admit: 2014-08-03 | Discharge: 2014-08-03 | Disposition: A | Discharge status: Home / Self Care | Payer: Self-pay | Attending: Emergency Medicine | Admitting: Emergency Medicine

## 2014-08-03 DIAGNOSIS — IMO0002 Reserved for concepts with insufficient information to code with codable children: Secondary | ICD-10-CM | POA: Insufficient documentation

## 2014-08-03 DIAGNOSIS — F17200 Nicotine dependence, unspecified, uncomplicated: Secondary | ICD-10-CM | POA: Insufficient documentation

## 2014-08-03 DIAGNOSIS — R1032 Left lower quadrant pain: Secondary | ICD-10-CM | POA: Insufficient documentation

## 2014-08-03 DIAGNOSIS — A749 Chlamydial infection, unspecified: Secondary | ICD-10-CM | POA: Insufficient documentation

## 2014-08-03 DIAGNOSIS — N730 Acute parametritis and pelvic cellulitis: Secondary | ICD-10-CM | POA: Insufficient documentation

## 2014-08-03 DIAGNOSIS — R1030 Lower abdominal pain, unspecified: Secondary | ICD-10-CM

## 2014-08-03 DIAGNOSIS — Z79899 Other long term (current) drug therapy: Secondary | ICD-10-CM | POA: Insufficient documentation

## 2014-08-03 DIAGNOSIS — Z8739 Personal history of other diseases of the musculoskeletal system and connective tissue: Secondary | ICD-10-CM | POA: Insufficient documentation

## 2014-08-03 DIAGNOSIS — R1031 Right lower quadrant pain: Secondary | ICD-10-CM | POA: Insufficient documentation

## 2014-08-03 DIAGNOSIS — G8929 Other chronic pain: Secondary | ICD-10-CM | POA: Insufficient documentation

## 2014-08-03 LAB — GC/CHLAMYDIA PROBE AMP
CT PROBE, AMP APTIMA: POSITIVE — AB
GC PROBE AMP APTIMA: NEGATIVE

## 2014-08-03 MED ORDER — HYDROCODONE-ACETAMINOPHEN 5-325 MG PO TABS
1.0000 | ORAL_TABLET | Freq: Once | ORAL | Status: AC
  Administered 2014-08-03: 1 via ORAL
  Filled 2014-08-03: qty 1

## 2014-08-03 MED ORDER — AZITHROMYCIN 250 MG PO TABS
1000.0000 mg | ORAL_TABLET | Freq: Once | ORAL | Status: AC
  Administered 2014-08-03: 1000 mg via ORAL
  Filled 2014-08-03: qty 4

## 2014-08-03 MED ORDER — HYDROCODONE-ACETAMINOPHEN 5-325 MG PO TABS: 1.0000 | ORAL_TABLET | Freq: Four times a day (QID) | ORAL | Status: AC | PRN

## 2014-08-03 MED ORDER — CEFTRIAXONE SODIUM 250 MG IJ SOLR
250.0000 mg | Freq: Once | INTRAMUSCULAR | Status: AC
  Administered 2014-08-03: 250 mg via INTRAMUSCULAR
  Filled 2014-08-03: qty 250

## 2014-08-03 MED ORDER — LIDOCAINE HCL (PF) 1 % IJ SOLN
0.9000 mL | Freq: Once | INTRAMUSCULAR | Status: AC
  Administered 2014-08-03: 0.9 mL
  Filled 2014-08-03: qty 5

## 2014-08-03 MED ORDER — DOXYCYCLINE HYCLATE 100 MG PO CAPS: 100.0000 mg | ORAL_CAPSULE | Freq: Two times a day (BID) | ORAL | Status: AC

## 2014-08-03 NOTE — Discharge Instructions (Signed)
You have been treated for gonorrhea and chlamydia in the ER. Your sexual partners will need to be treated as well. You're being treated for pelvic inflammatory disease which is an infection causing inflammation to the pelvic organs. Take Doxycycline as directed for 2 weeks, and avoid direct sunlight while taking this. Use Norco directed, as needed for pain, but don't drive while taking this medication. Continue your flagyl as directed from yesterday's ER visit to help treat the bacterial vaginosis. Follow up in 5 days with the women's clinic/your primary doctor for ongoing evaluation.  Follow up with [facility] STD clinic for future STD concerns or screenings. This is the recommendation by the CDC for people with multiple sexual partners or hx of STDs. Return to the ER for any changing or worsening symptoms.   Abdominal Pain Many things can cause belly (abdominal) pain. Most times, the belly pain is not dangerous. Many cases of belly pain can be watched and treated at home. HOME CARE   Do not take medicines that help you go poop (laxatives) unless told to by your doctor.  Only take medicine as told by your doctor.  Eat or drink as told by your doctor. Your doctor will tell you if you should be on a special diet. GET HELP IF:  You do not know what is causing your belly pain.  You have belly pain while you are sick to your stomach (nauseous) or have runny poop (diarrhea).  You have pain while you pee or poop.  Your belly pain wakes you up at night.  You have belly pain that gets worse or better when you eat.  You have belly pain that gets worse when you eat fatty foods.  You have a fever. GET HELP RIGHT AWAY IF:   The pain does not go away within 2 hours.  You keep throwing up (vomiting).  The pain changes and is only in the right or left part of the belly.  You have bloody or tarry looking poop. MAKE SURE YOU:   Understand these instructions.  Will watch  your condition.  Will get help right away if you are not doing well or get worse. Document Released: 04/08/2008 Document Revised: 10/26/2013 Document Reviewed: 06/30/2013 [facility] Patient Information 2015 [place], [place]. This information is not intended to replace advice given to you by your health care provider. Make sure you discuss any questions you have with your health care provider.  Chlamydia Chlamydia is an infection. It is spread from one person to another person during sexual contact. This infection can be in the cervix, urine tube (urethra), throat, or bottom (rectum). This infection needs treatment. HOME CARE   Take your medicines (antibiotics) as told. Finish them even if you start to feel better.  Only take medicine as told by your doctor.  Tell your sex partner(s) that you have chlamydia. They must also be treated.  Do not have sex until your doctor says it is okay.  Rest.  Eat healthy. Drink enough fluids to keep your pee (urine) clear or pale yellow.  Keep all doctor visits as told. GET HELP IF:  You have pain when you pee.  You have belly pain.  You have vaginal discharge.  You have pain during sex.  You have bleeding between periods and after sex.  You have a fever. GET HELP RIGHT AWAY IF:   You feel sick to your stomach (nauseous) or you throw up (vomit).  You sweat much more than normal (  diaphoresis).  You have trouble swallowing. MAKE SURE YOU:   Understand these instructions.  Will watch your condition.  Will get help right away if you are not doing well or get worse. Document Released: 07/30/2008 Document Revised: 03/07/2014 Document Reviewed: 06/28/2013 [facility] Patient Information 2015 [place]. This information is not intended to replace advice given to you by your health care provider. Make sure you discuss any questions you have with your health care provider.  Pelvic Inflammatory Disease Pelvic inflammatory disease (PID) is  an infection in some or all of the female organs. PID can be in the uterus, ovaries, fallopian tubes, or the surrounding tissues inside the lower belly area (pelvis). HOME CARE   If given, take your antibiotic medicine as told. Finish them even if you start to feel better.  Only take medicine as told by your doctor.  Do not have sex (intercourse) until treatment is done or as told by your doctor.  Tell your sex partner if you have PID. Your partner may need to be treated.  Keep all doctor visits. GET HELP RIGHT AWAY IF:   You have a fever.  You have more belly (abdominal) or lower belly pain.  You have chills.  You have pain when you pee (urinate).  You are not better after 72 hours.  You have more fluid (discharge) coming from your vagina or fluid that is not normal.  You need pain medicine from your doctor.  You throw up (vomit).  You cannot take your medicines.  Your partner has a sexually transmitted disease (STD). MAKE SURE YOU:   Understand these instructions.  Will watch your condition.  Will get help right away if you are not doing well or get worse. Document Released: 01/17/2009 Document Revised: 02/15/2013 Document Reviewed: 10/17/2011 [facility] Patient Information 2015 [place], [place]. This information is not intended to replace advice given to you by your health care provider. Make sure you discuss any questions you have with your health care provider.  Sexually Transmitted Disease A sexually transmitted disease (STD) is a disease or infection often passed to another person during sex. However, STDs can be passed through nonsexual ways. An STD can be passed through:  Spit (saliva).  Semen.  Blood.  Mucus from the vagina.  Pee (urine). HOW CAN I LESSEN MY CHANCES OF GETTING AN STD?  Use:  Latex condoms.  Water-soluble lubricants with condoms. Do not use petroleum jelly or oils.  Dental dams. These are small pieces of latex that are used as a  barrier during oral sex.  Avoid having more than one sex partner.  Do not have sex with someone who has other sex partners.  Do not have sex with anyone you do not know or who is at high risk for an STD.  Avoid risky sex that can break your skin.  Do not have sex if you have open sores on your mouth or skin.  Avoid drinking too much alcohol or taking illegal drugs. Alcohol and drugs can affect your good judgment.  Avoid oral and anal sex acts.  Get shots (vaccines) for HPV and hepatitis.  If you are at risk of being infected with HIV, it is advised that you take a certain medicine daily to prevent HIV infection. This is called pre-exposure prophylaxis (PrEP). You may be at risk if:  You are a man who has sex with other men (MSM).  You are attracted to the opposite sex (heterosexual) and are having sex with more than one  partner.  You take drugs with a needle.  You have sex with someone who has HIV.  Talk with your doctor about if you are at high risk of being infected with HIV. If you begin to take PrEP, get tested for HIV first. Get tested every 3 months for as long as you are taking PrEP. WHAT SHOULD I DO IF I THINK I HAVE AN STD?  See your doctor.  Tell your sex partner(s) that you have an STD. They should be tested and treated.  Do not have sex until your doctor says it is okay. WHEN SHOULD I GET HELP? Get help right away if:  You have bad belly (abdominal) pain.  You are a man and have puffiness (swelling) or pain in your testicles.  You are a woman and have puffiness in your vagina. Document Released: 11/28/2004 Document Revised: 10/26/2013 Document Reviewed: 04/16/2013 [facility] Patient Information 2015 [place], [place]. This information is not intended to replace advice given to you by your health care provider. Make sure you discuss any questions you have with your health care provider.

## 2014-08-03 NOTE — ED Notes (Addendum)
Pt eval here for abd pain and vag d/c yesterday was told she had ovairain cysts and theyare still hurting states was given rx for tramdol it is not helping

## 2014-08-03 NOTE — ED Provider Notes (Signed)
Medical screening examination/treatment/procedure(s) were performed by non-physician practitioner and as supervising physician I was immediately available for consultation/collaboration.   EKG Interpretation None        Vanetta MuldersScott Melesio Madara, MD 08/03/14 1746

## 2014-08-03 NOTE — ED Provider Notes (Signed)
CSN: [id number]     Arrival date & time 08/03/14  1431 History   First MD Initiated Contact with Patient 08/03/14 1645     Chief Complaint  Patient presents with  . Abdominal Pain     (Consider location/radiation/quality/duration/timing/severity/associated sxs/prior Treatment) HPI Comments: Angie Humphrey is a 33 y.o. female with a PMHx of fibromyalgia and chronic back pain, who presents to the ED for ongoing unchanged abdominal pain x1.5wks for which she was seen yesterday and diagnosed with BV and ovarian cysts and tramadol and Flagyl which have not helped. She states the pain is the same as was yesterday, reports it as 10/10 lower abdominal sharp crampy constant nonradiating pain, states L side>R side, worse with lying flat on her back as well as palpation and sexual activity, and improved with lying on her sides, and unrelieved with tramadol. Endorses dyspareunia. Denies fevers, chills, CP, SOB, cough, back pain, flank pain, dysuria, hematuria, n/v/d/c, obstipation, myalgias, arthralgias, diet changes, recent travel or suspicious food intake, or sick contacts. Sexually active with one female at this time, unprotected. LMP 1.5wks ago, preg test yesterday was neg. Has not been able to have sex in several days due to this pain. Denies ongoing vaginal discharge that is different than yesterday.   Patient is a 33 y.o. female presenting with abdominal pain. The history is provided by the patient. No language interpreter was used.  Abdominal Pain Pain location:  LLQ and RLQ Pain quality: cramping and sharp   Pain radiates to:  Does not radiate Pain severity:  Severe (10/10) Onset quality:  Gradual Duration:  2 weeks Timing:  Constant Progression:  Unchanged Chronicity:  New Context: not diet changes, not recent sexual activity, not recent travel, not sick contacts and not suspicious food intake   Relieved by:  Lying down (laying on her sides) Worsened by:  Palpation and position changes  (palpation, laying flat, and sexual activity) Ineffective treatments: tramadol. Associated symptoms: no anorexia, no chest pain, no chills, no constipation, no cough, no diarrhea, no dysuria, no fever, no flatus, no hematemesis, no hematochezia, no hematuria, no melena, no nausea, no shortness of breath, no vaginal bleeding, no vaginal discharge and no vomiting     Past Medical History  Diagnosis Date  . Fibromyalgia   . Environmental allergies   . Chronic back pain    History reviewed. No pertinent past surgical history. No family history on file. History  Substance Use Topics  . Smoking status: Current Every Day Smoker    Types: Cigarettes  . Smokeless tobacco: Not on file  . Alcohol Use: Yes     Comment: occ   OB History   Grav Para Term Preterm Abortions TAB SAB Ect Mult Living                 Review of Systems  Constitutional: Negative for fever and chills.  Respiratory: Negative for cough and shortness of breath.   Cardiovascular: Negative for chest pain.  Gastrointestinal: Positive for abdominal pain. Negative for nausea, vomiting, diarrhea, constipation, melena, hematochezia, abdominal distention, anorexia, flatus and hematemesis.  Genitourinary: Positive for pelvic pain and dyspareunia. Negative for dysuria, frequency, hematuria, flank pain, vaginal bleeding and vaginal discharge.  Musculoskeletal: Negative for arthralgias and myalgias.  Skin: Negative for color change.  Neurological: Negative for weakness and light-headedness.   10 Systems reviewed and are negative for acute change except as noted in the HPI.    Allergies  Aspirin and Ibuprofen  Home Medications   Prior  to Admission medications   Medication Sig Start Date End Date Taking? Authorizing Provider  diphenhydrAMINE (BENADRYL) 25 MG tablet Take 25 mg by mouth every 6 (six) hours as needed for sleep.    Historical Provider, MD  metroNIDAZOLE (FLAGYL) 500 MG tablet Take 1 tablet (500 mg total) by  mouth 2 (two) times daily. 08/02/14   Kaitlyn Szekalski, PA-C   BP 115/87  Pulse 97  Temp(Src) 98.9 F (37.2 C) (Oral)  Resp 16  SpO2 99% Physical Exam  Nursing note and vitals reviewed. Constitutional: She is oriented to person, place, and time. Vital signs are normal. She appears well-developed and well-nourished. No distress.  VSS, NAD, well appearing thin female  HENT:  Head: Normocephalic and atraumatic.  Mouth/Throat: Mucous membranes are normal.  MMM  Eyes: Conjunctivae and EOM are normal. Right eye exhibits no discharge. Left eye exhibits no discharge.  Neck: Normal range of motion. Neck supple.  Cardiovascular: Normal rate and intact distal pulses.   Pulmonary/Chest: Effort normal. No respiratory distress.  Abdominal: Soft. Normal appearance and bowel sounds are normal. She exhibits no distension. There is tenderness in the right lower quadrant and left lower quadrant. There is no rigidity, no rebound, no guarding, no CVA tenderness, no tenderness at McBurney's point and negative Murphy's sign.    Soft, nondistended, +BS throughout, tender in RLQ and LLQ near pelvic brim, no r/g/r, neg murphys, neg mcburney's, no CVA TTP.   Genitourinary:  Deferred due to recent pelvic exam performed yesterday  Musculoskeletal: Normal range of motion.  Neurological: She is alert and oriented to person, place, and time.  Skin: Skin is warm, dry and intact. No rash noted.  Psychiatric: She has a normal mood and affect.    ED Course  Procedures (including critical care time) Labs Review Labs Reviewed - No data to display Results for orders placed during the hospital encounter of 08/02/14  WET PREP, GENITAL      Result Value Ref Range   Yeast Wet Prep HPF POC FEW (*) NONE SEEN   Trich, Wet Prep NONE SEEN  NONE SEEN   Clue Cells Wet Prep HPF POC TOO NUMEROUS TO COUNT (*) NONE SEEN   WBC, Wet Prep HPF POC TOO NUMEROUS TO COUNT (*) NONE SEEN  GC/CHLAMYDIA PROBE AMP      Result Value Ref  Range   CT Probe RNA POSITIVE (*) NEGATIVE   GC Probe RNA NEGATIVE  NEGATIVE  URINALYSIS, ROUTINE W REFLEX MICROSCOPIC      Result Value Ref Range   Color, Urine YELLOW  YELLOW   APPearance CLEAR  CLEAR   Specific Gravity, Urine 1.020  1.005 - 1.030   pH 8.0  5.0 - 8.0   Glucose, UA NEGATIVE  NEGATIVE mg/dL   Hgb urine dipstick NEGATIVE  NEGATIVE   Bilirubin Urine NEGATIVE  NEGATIVE   Ketones, ur NEGATIVE  NEGATIVE mg/dL   Protein, ur NEGATIVE  NEGATIVE mg/dL   Urobilinogen, UA 0.2  0.0 - 1.0 mg/dL   Nitrite NEGATIVE  NEGATIVE   Leukocytes, UA NEGATIVE  NEGATIVE  CBC WITH DIFFERENTIAL      Result Value Ref Range   WBC 11.4 (*) 4.0 - 10.5 K/uL   RBC 4.50  3.87 - 5.11 MIL/uL   Hemoglobin 14.9  12.0 - 15.0 g/dL   HCT 16.143.6  09.636.0 - 04.546.0 %   MCV 96.9  78.0 - 100.0 fL   MCH 33.1  26.0 - 34.0 pg   MCHC 34.2  30.0 -  36.0 g/dL   RDW 16.1  09.6 - 04.5 %   Platelets 282  150 - 400 K/uL   Neutrophils Relative % 67  43 - 77 %   Neutro Abs 7.5  1.7 - 7.7 K/uL   Lymphocytes Relative 25  12 - 46 %   Lymphs Abs 2.8  0.7 - 4.0 K/uL   Monocytes Relative 7  3 - 12 %   Monocytes Absolute 0.8  0.1 - 1.0 K/uL   Eosinophils Relative 1  0 - 5 %   Eosinophils Absolute 0.2  0.0 - 0.7 K/uL   Basophils Relative 0  0 - 1 %   Basophils Absolute 0.1  0.0 - 0.1 K/uL  BASIC METABOLIC PANEL      Result Value Ref Range   Sodium 139  137 - 147 mEq/L   Potassium 4.2  3.7 - 5.3 mEq/L   Chloride 101  96 - 112 mEq/L   CO2 27  19 - 32 mEq/L   Glucose, Bld 90  70 - 99 mg/dL   BUN 10  6 - 23 mg/dL   Creatinine, Ser 4.09  0.50 - 1.10 mg/dL   Calcium 9.5  8.4 - 81.1 mg/dL   GFR calc non Af Amer >90  >90 mL/min   GFR calc Af Amer >90  >90 mL/min   Anion gap 11  5 - 15  POC URINE PREG, ED      Result Value Ref Range   Preg Test, Ur NEGATIVE  NEGATIVE   Imaging Review US Transvaginal Non-ob  08/02/2014   CLINICAL DATA:  Abdominal and pelvic pain. History of ovarian cysts.  EXAM: TRANSABDOMINAL AND  TRANSVAGINAL ULTRASOUND OF PELVIS  TECHNIQUE: Both transabdominal and transvaginal ultrasound examinations of the pelvis were performed. Transabdominal technique was performed for global imaging of the pelvis including uterus, ovaries, adnexal regions, and pelvic cul-de-sac. It was necessary to proceed with endovaginal exam following the transabdominal exam to visualize the uterus, ovaries, and adnexa.  COMPARISON:  05/06/2013  FINDINGS: Uterus:  7.5 x 3.9 x 4.8 cm.  Normal in morphology.  Endometrium:  Normal, 9 mm.  Right Ovary: 3.9 x 1.8 x 2.9 cm. Peripherally hypervascular lesion within measures maximally 1.5 cm on image 44.  Left Ovary:  2.2 x 1.5 x 2.2 cm.  Normal in morphology.  Other Findings:  Trace free pelvic fluid is likely physiologic.  IMPRESSION: 1. Right ovarian corpus luteal cyst. 2. Otherwise, normal pelvic ultrasound for age.   Electronically Signed   By: Jeronimo Greaves M.D.   On: 08/02/2014 13:56   US Pelvis Complete  08/02/2014   CLINICAL DATA:  Abdominal and pelvic pain. History of ovarian cysts.  EXAM: TRANSABDOMINAL AND TRANSVAGINAL ULTRASOUND OF PELVIS  TECHNIQUE: Both transabdominal and transvaginal ultrasound examinations of the pelvis were performed. Transabdominal technique was performed for global imaging of the pelvis including uterus, ovaries, adnexal regions, and pelvic cul-de-sac. It was necessary to proceed with endovaginal exam following the transabdominal exam to visualize the uterus, ovaries, and adnexa.  COMPARISON:  05/06/2013  FINDINGS: Uterus:  7.5 x 3.9 x 4.8 cm.  Normal in morphology.  Endometrium:  Normal, 9 mm.  Right Ovary: 3.9 x 1.8 x 2.9 cm. Peripherally hypervascular lesion within measures maximally 1.5 cm on image 44.  Left Ovary:  2.2 x 1.5 x 2.2 cm.  Normal in morphology.  Other Findings:  Trace free pelvic fluid is likely physiologic.  IMPRESSION: 1. Right ovarian corpus luteal cyst. 2. Otherwise, normal pelvic  ultrasound for age.   Electronically Signed   By:  Jeronimo Greaves M.D.   On: 08/02/2014 13:56     EKG Interpretation None      MDM   Final diagnoses:  PID (acute pelvic inflammatory disease)  Lower abdominal pain  Chlamydia infection     32y/o female here for abd pain. Seen yesterday, pelvic performed and showed bilateral adnexal TTP, U/S showing ovarian cysts, and wet prep showing BV, given flagyl and tramadol which have not helped. Not treated for GC/CT empirically but labs back today showing CT+. Will treat for both today with azithro and rocephin. Given pelvic exam finding yesterday of b/l adnexal pain, and U/S not showing any TOA, will treat for PID but do not feel further imaging is necessary at this time. Will give Vicodin while here, and rx for home. Tolerating PO well, afebrile and nontoxic, doubt need for admission. Discussed f/up at [facility] clinic in 5 days for recheck. Discussed importance of partner's being treated. Will have her continue Flagyl as directed. I explained the diagnosis and have given explicit precautions to return to the ER including for any other new or worsening symptoms. The patient understands and accepts the medical plan as it's been dictated and I have answered their questions. Discharge instructions concerning home care and prescriptions have been given. The patient is STABLE and is discharged to home in good condition.  BP 115/87  Pulse 97  Temp(Src) 98.9 F (37.2 C) (Oral)  Resp 16  SpO2 99%  Meds ordered this encounter  Medications  . azithromycin (ZITHROMAX) tablet 1,000 mg    Sig:    And  . cefTRIAXone (ROCEPHIN) injection 250 mg    Sig:     Order Specific Question:  Antibiotic Indication:    Answer:  STD  . HYDROcodone-acetaminophen (NORCO/VICODIN) 5-325 MG per tablet 1 tablet    Sig:   . lidocaine (PF) (XYLOCAINE) 1 % injection 0.9 mL    Sig:   . HYDROcodone-acetaminophen (NORCO) 5-325 MG per tablet    Sig: Take 1 tablet by mouth every 6 (six) hours as needed for severe pain.     Dispense:  10 tablet    Refill:  0    Order Specific Question:  Supervising Provider    Answer:  Eber Hong D [[id number]]  . doxycycline (VIBRAMYCIN) 100 MG capsule    Sig: Take 1 capsule (100 mg total) by mouth 2 (two) times daily. One po bid x 14 days    Dispense:  28 capsule    Refill:  0    Order Specific Question:  Supervising Provider    Answer:  Eber Hong D [[id number]]      [place] Camprubi-Soms, PA-C 08/03/14 1745

## 2014-08-04 ENCOUNTER — Telehealth (HOSPITAL_BASED_OUTPATIENT_CLINIC_OR_DEPARTMENT_OTHER): Payer: Self-pay | Admitting: Emergency Medicine

## 2014-08-05 ENCOUNTER — Telehealth (HOSPITAL_COMMUNITY): Payer: Self-pay

## 2014-08-07 ENCOUNTER — Telehealth (HOSPITAL_BASED_OUTPATIENT_CLINIC_OR_DEPARTMENT_OTHER): Payer: Self-pay | Admitting: Emergency Medicine

## 2014-08-08 ENCOUNTER — Telehealth (HOSPITAL_BASED_OUTPATIENT_CLINIC_OR_DEPARTMENT_OTHER): Payer: Self-pay

## 2014-08-08 NOTE — Telephone Encounter (Signed)
Unable to reach by phone x 3 attempts.  Letter sent to [facility] address. 

## 2014-09-05 ENCOUNTER — Telehealth (HOSPITAL_COMMUNITY): Payer: Self-pay

## 2014-09-05 NOTE — ED Notes (Signed)
Unable to contact pt by mail or telephone. Unable to communicate lab results or treatment changes. 

## 2015-05-13 IMAGING — US US PELVIS LIMITED
1 series · 13 of 25 positions shown · non-contrast
Comparison: 05/04/2008

CLINICAL DATA: Pelvic pain with vaginal bleeding.  LMP 05/05/2013



[Series 1: us pelvis limited · 0.18mm/px · 13 of 116 slices shown]
[im 1/116]
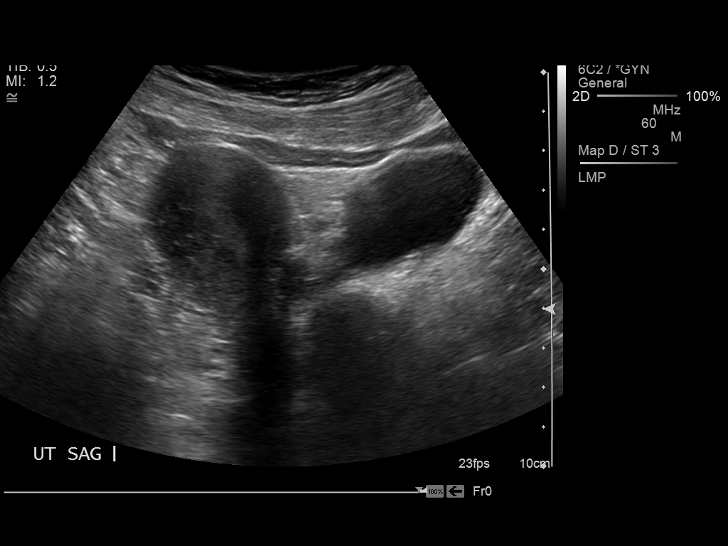
[im 10/116]
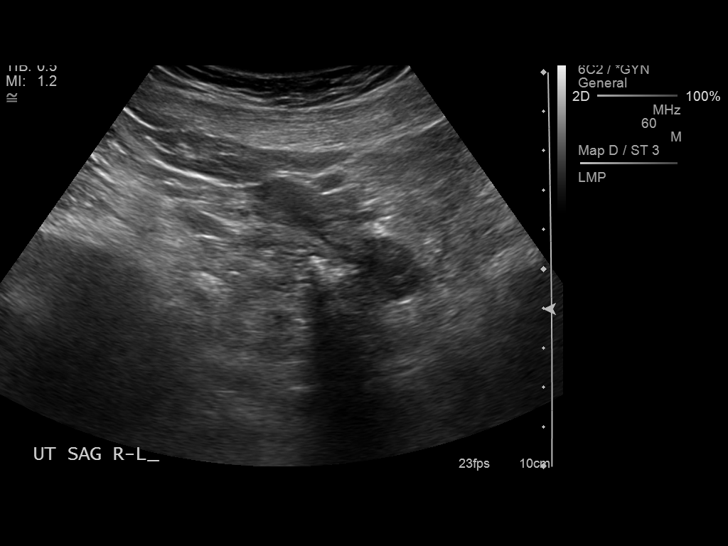
[im 20/116]
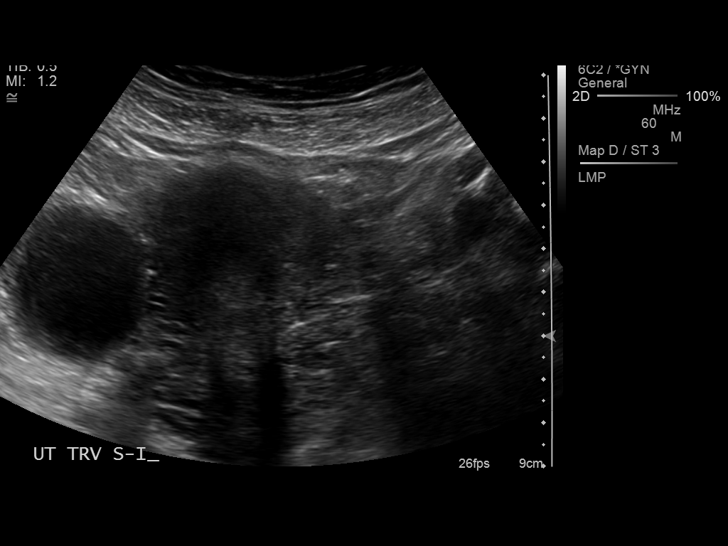
[im 29/116]
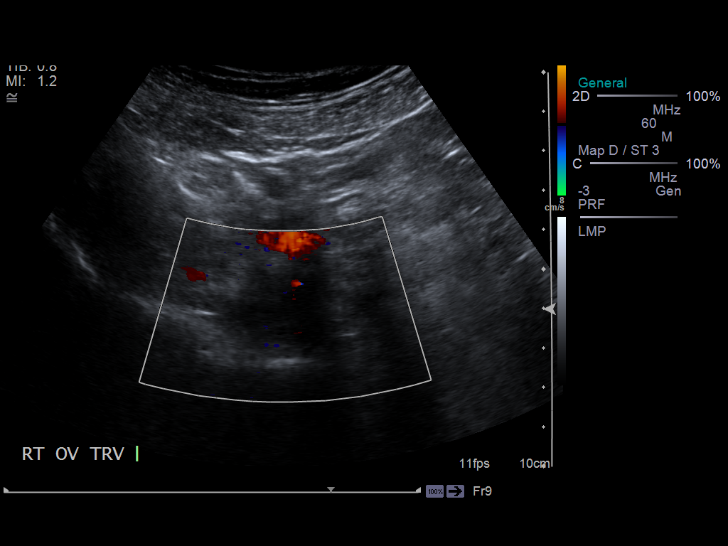
[im 39/116]
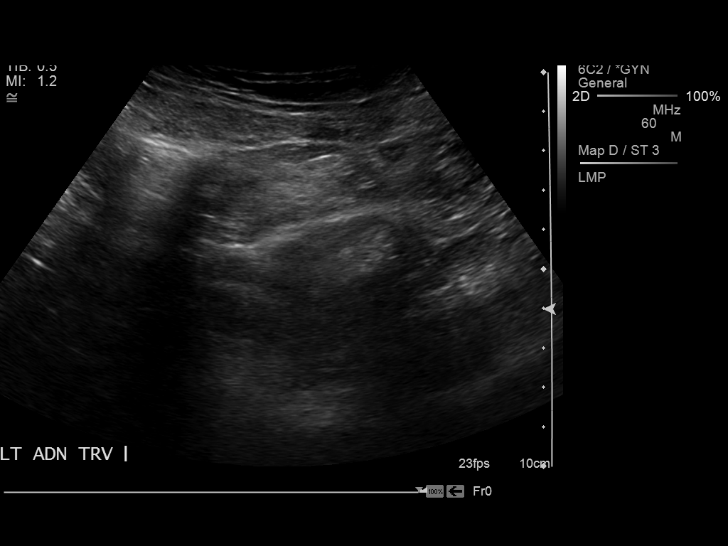
[im 48/116]
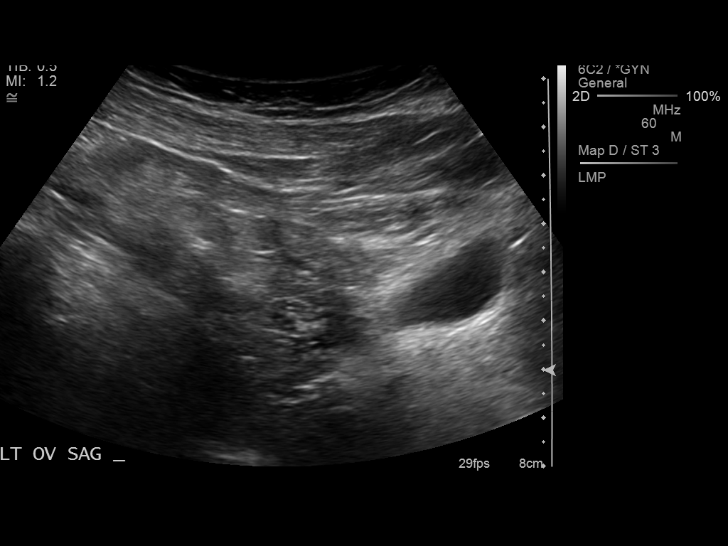
[im 58/116]
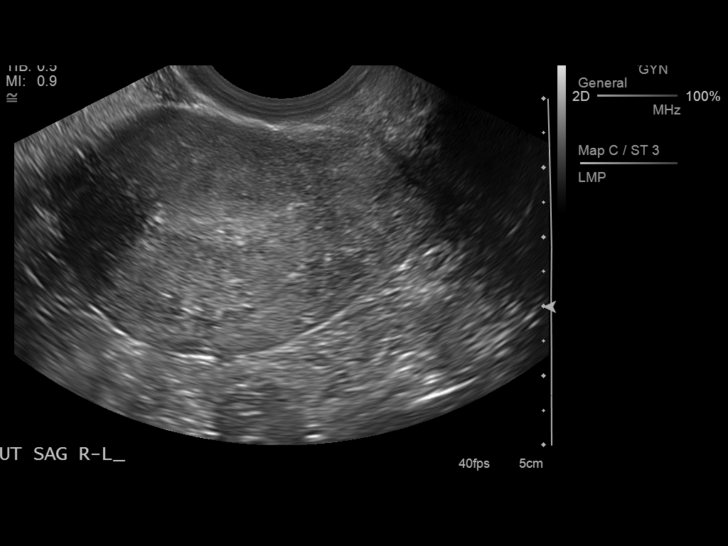
[im 68/116]
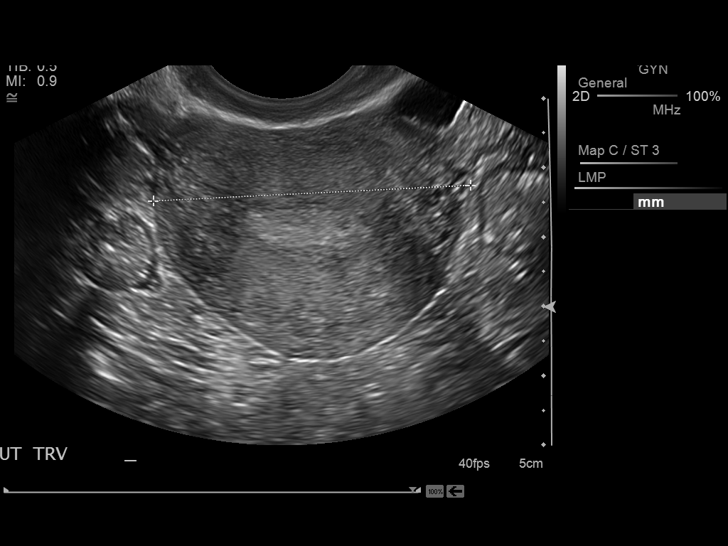
[im 77/116]
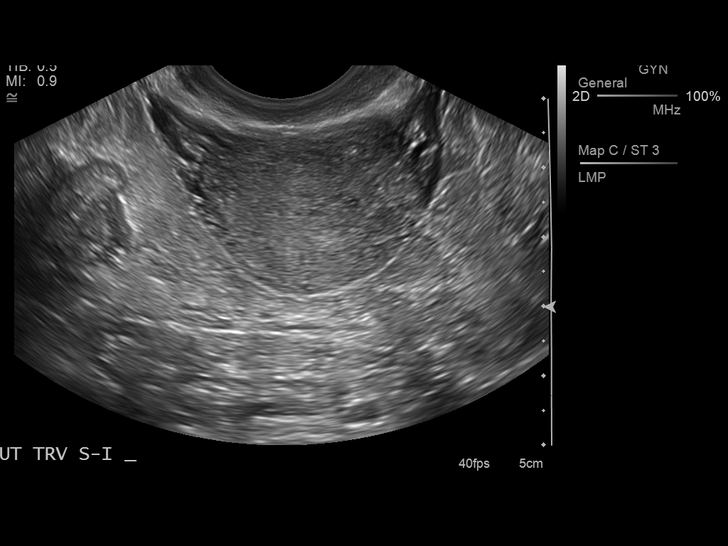
[im 87/116]
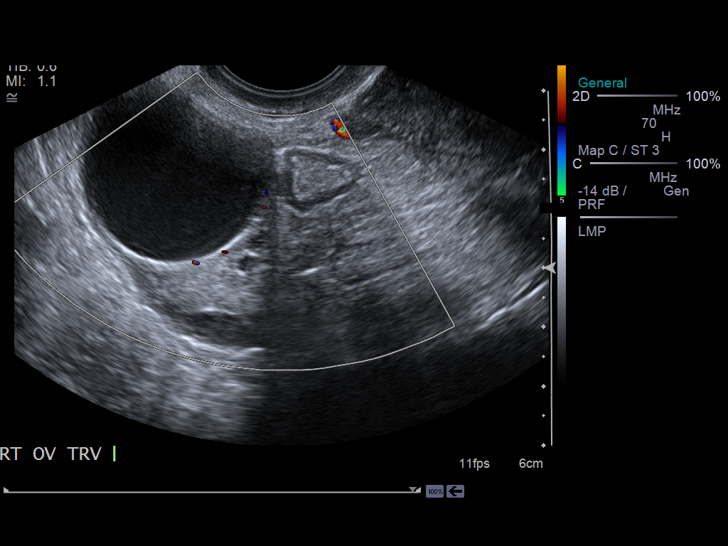
[im 96/116]
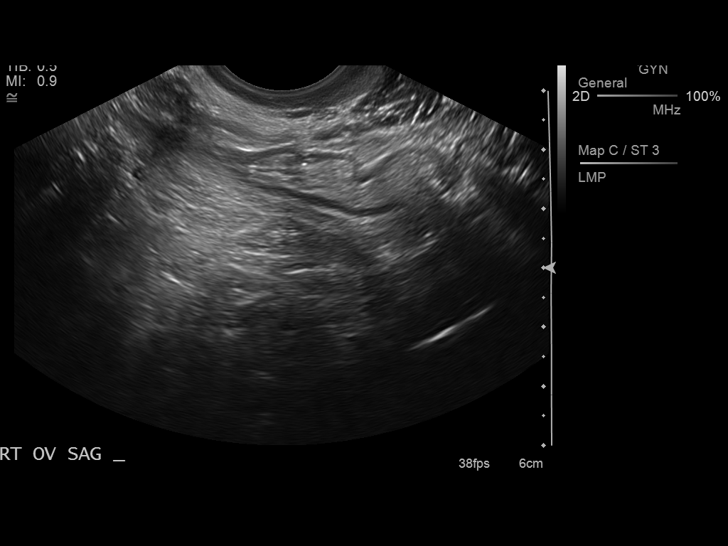
[im 106/116]
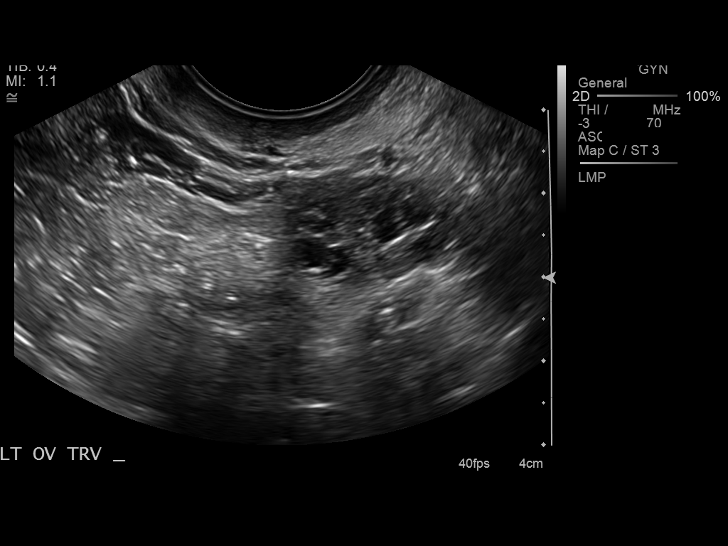
[im 116/116]
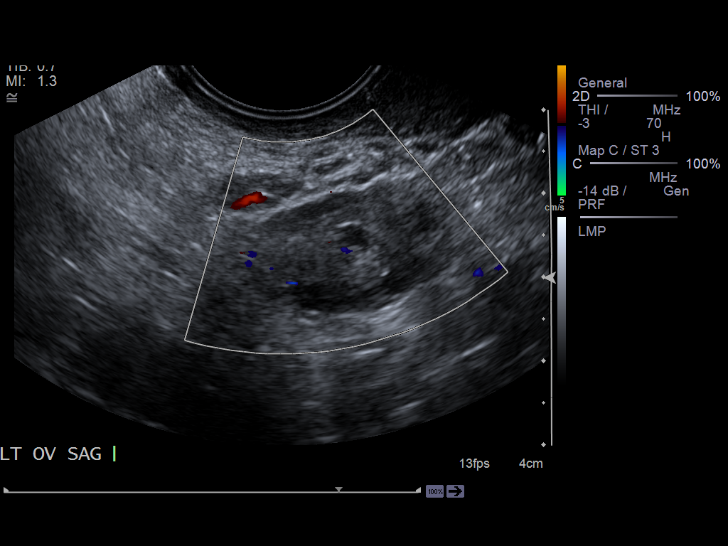

[13 of 25 positions shown; findings below may reference images not displayed]

FINDINGS: Uterus: Is anteverted and anteflexed and demonstrates a sagittal
length of 7.2 cm, depth of 3.8 cm and width of 4.6 cm.  No focal
mural abnormality is seen

Endometrium: Is homogeneously echogenic with a width of 6.9 mm.  No
areas of focal thickening or heterogeneity are seen

Right ovary:  Measures 4.7 x 3.7 x 3.4 cm and contains a slightly
complex cystic lesion measuring 3.0 x 3.9 x 3.0 cm.  This
demonstrateslow level internal echoes and some very fine the lace-
likelinear the structures.  The internal contents are avascular
with color Doppler exam and this likely represents a hemorrhagic
cyst with an endometrioma secondary consideration.  Some flow is
seen in the surrounding stroma with color Doppler exam

Left ovary: Has a normal appearance measuring 2.7 x 1.5 x 2.8 cm.
Some flow is identified within the ovarian stroma with color
Doppler evaluation

Other findings: No pelvic fluid or separate adnexal masses are
seen.
IMPRESSION: Slightly complex right ovarian cyst has an appearance most
suggestive of a hemorrhagic cyst with an endometrioma a secondary
consideration.  Flow is noted within the ovarian stroma
bilaterally.  While this finding would mitigate against the
presence of ovarian torsion, it does not exclude it absolutely and
treatment should not be deferred in the face of strong clinical
suspicion.  Follow-up evaluation is recommended in 6-8 weeks for
initial reassessment of the right ovarian lesion.  If this persists
than an endometrioma would be likely.  If interval
evolution/resolution can be documented, a hemorrhagic cyst can be
confirmed..

## 2016-08-08 IMAGING — US US PELVIS COMPLETE
1 series · 14 of 25 positions shown · non-contrast
Comparison: 05/06/2013

CLINICAL DATA: Abdominal and pelvic pain. History of ovarian cysts.

EXAM:
TRANSABDOMINAL AND TRANSVAGINAL ULTRASOUND OF PELVIS
TECHNIQUE: Both transabdominal and transvaginal ultrasound examinations of the
pelvis were performed. Transabdominal technique was performed for
global imaging of the pelvis including uterus, ovaries, adnexal
regions, and pelvic cul-de-sac. It was necessary to proceed with
endovaginal exam following the transabdominal exam to visualize the
uterus, ovaries, and adnexa.

[Series 1: us pelvis complete · 0.24mm/px · 59 acquisitions, 14 frames shown]
[im 1/59]
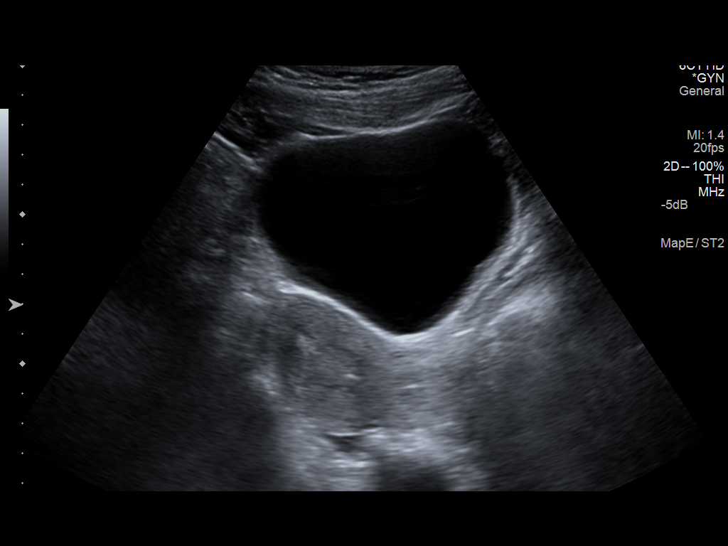
[im 5/59]
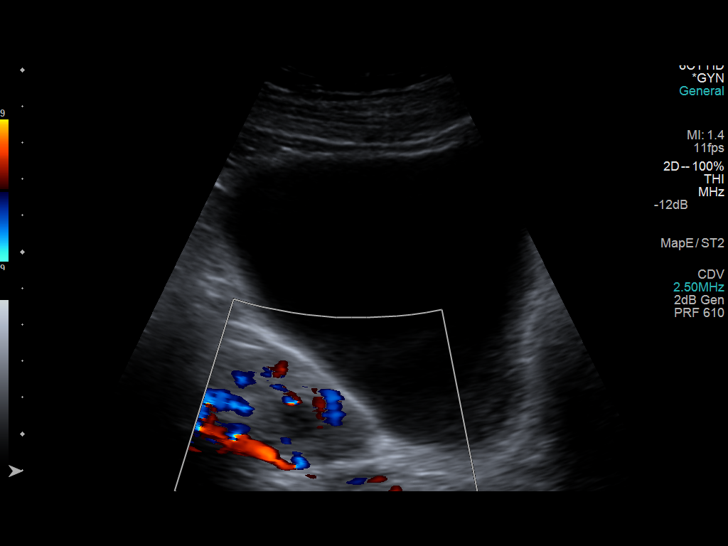
[im 10/59]
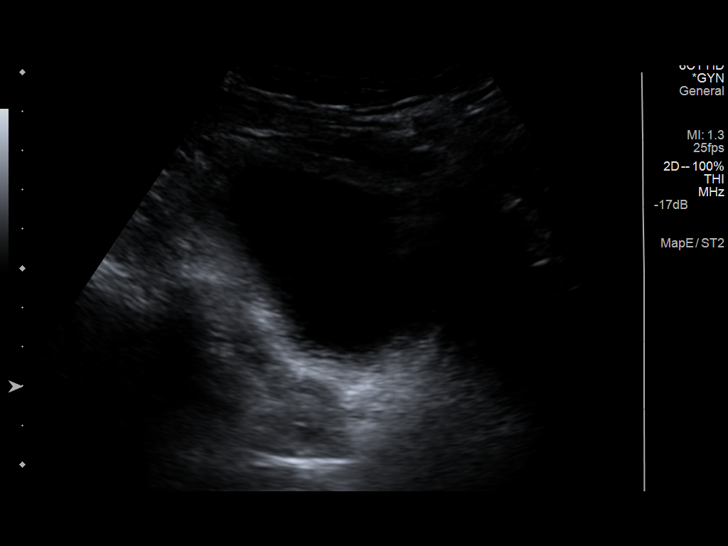
[im 15/59]
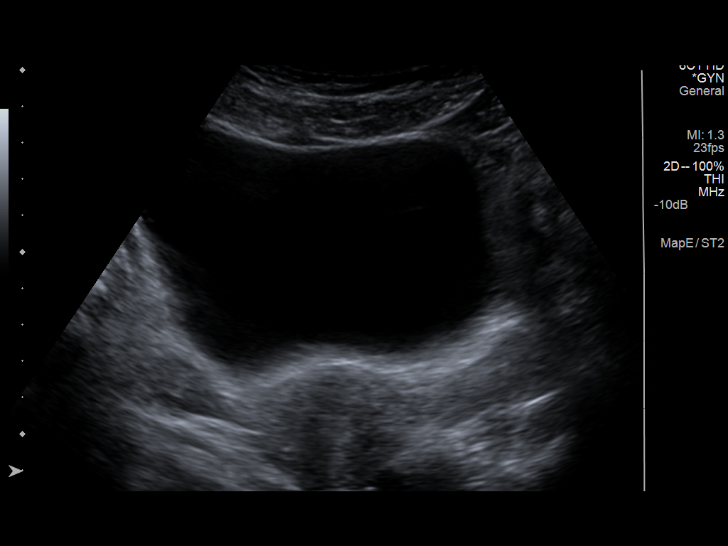
[im 20/59]
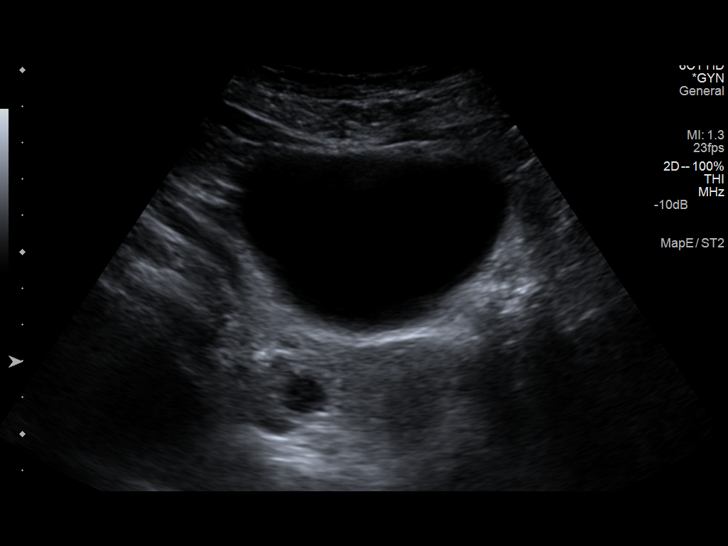
[im 22/59]
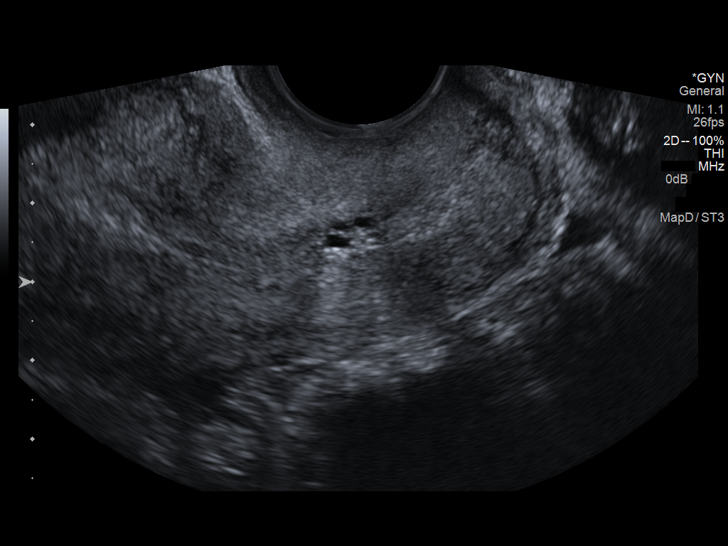
[im 27/59]
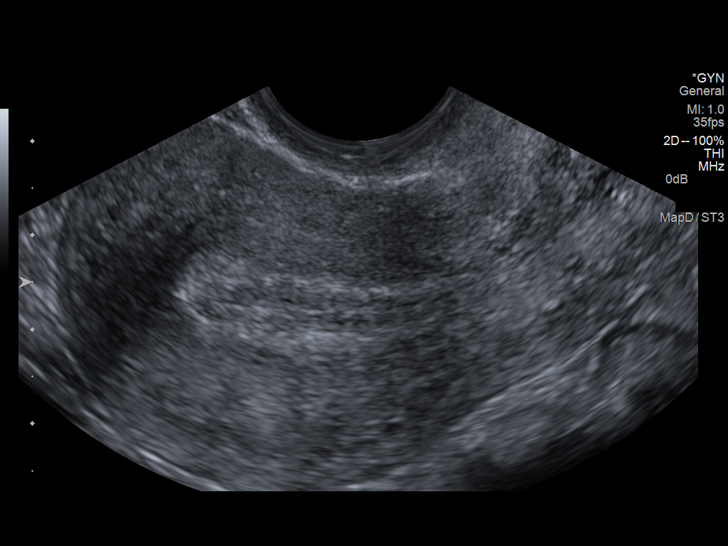
[im 32/59]
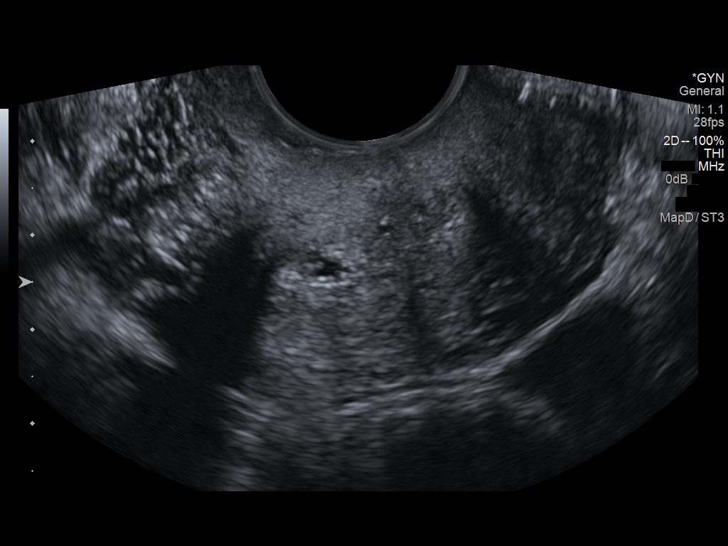
[im 37/59]
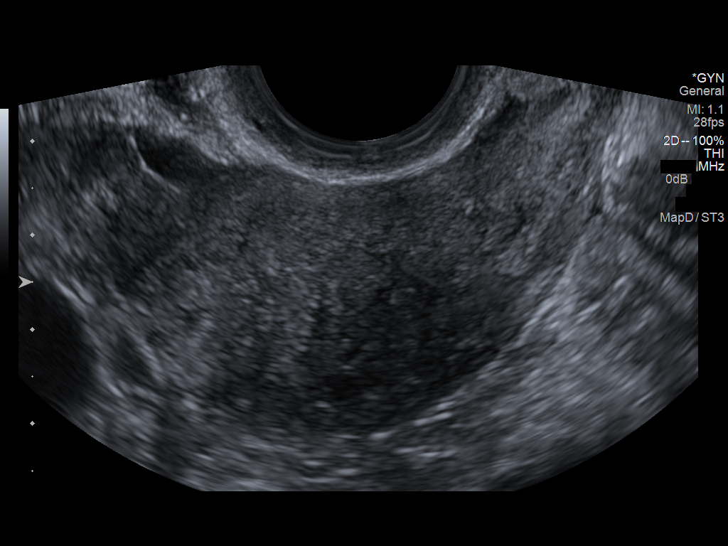
[im 39/59]
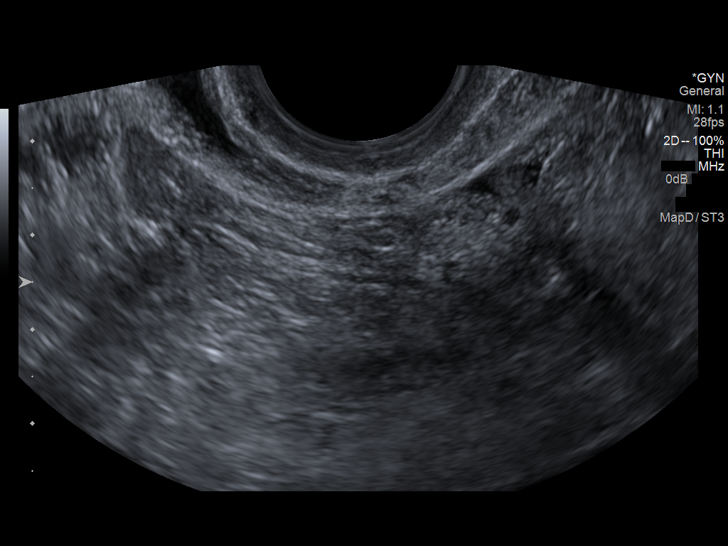
[im 44/59]
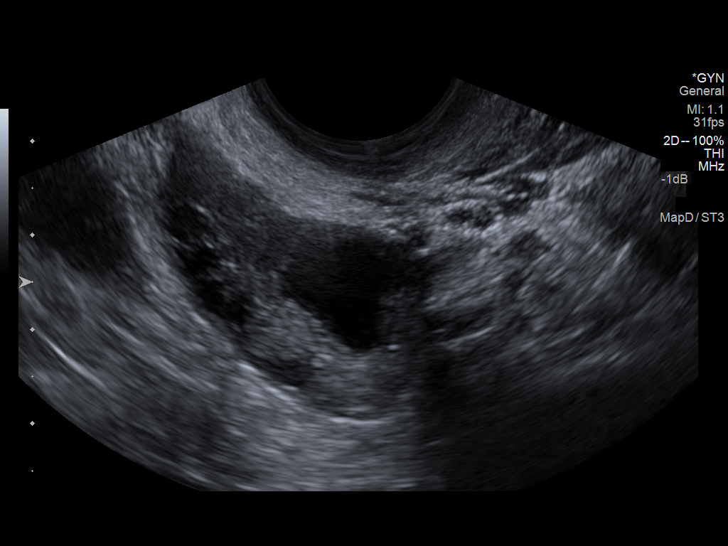
[im 49/59]
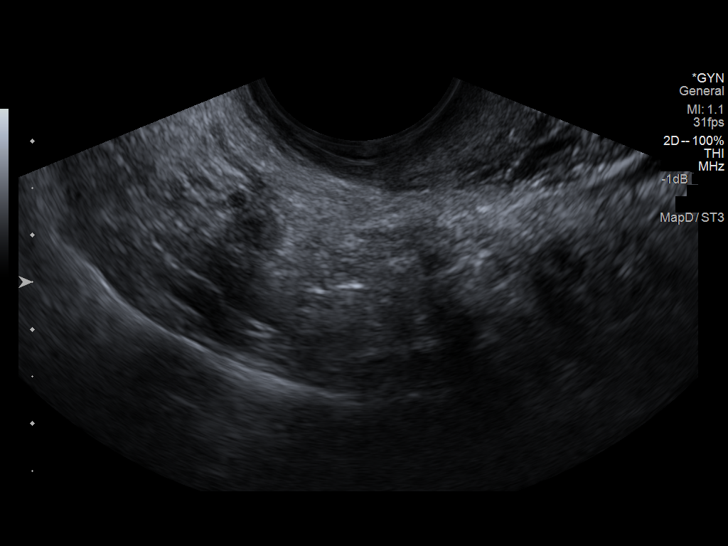
[im 54/59]
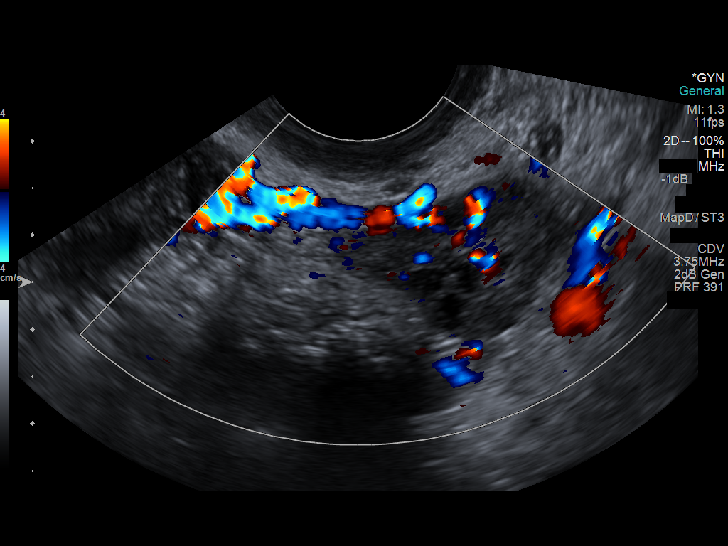
[im 59/59]
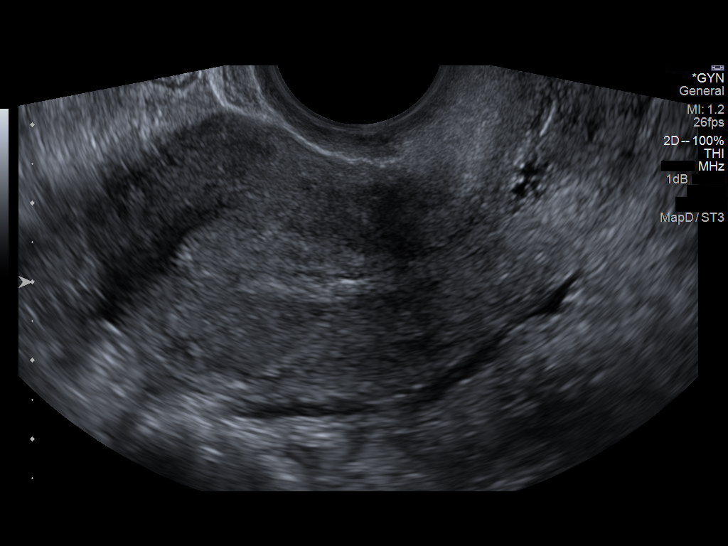

[14 of 25 positions shown; findings below may reference images not displayed]

FINDINGS: Uterus:  7.5 x 3.9 x 4.8 cm.  Normal in morphology.

Endometrium:  Normal, 9 mm.

Right Ovary: 3.9 x 1.8 x 2.9 cm. Peripherally hypervascular lesion
within measures maximally 1.5 cm on image 44.

Left Ovary:  2.2 x 1.5 x 2.2 cm.  Normal in morphology.

Other Findings:  Trace free pelvic fluid is likely physiologic.
IMPRESSION: 1. Right ovarian corpus luteal cyst.
2. Otherwise, normal pelvic ultrasound for age.

## 2021-08-07 ENCOUNTER — Other Ambulatory Visit: Payer: Self-pay | Admitting: Family Medicine

## 2021-08-07 DIAGNOSIS — R519 Headache, unspecified: Secondary | ICD-10-CM

## 2021-08-07 DIAGNOSIS — H539 Unspecified visual disturbance: Secondary | ICD-10-CM

## 2021-08-07 DIAGNOSIS — R202 Paresthesia of skin: Secondary | ICD-10-CM
